# Patient Record
Sex: Female | Born: 1960 | Race: White | Hispanic: No | Marital: Married | Smoking: Current every day smoker
Health system: Southern US, Community
[De-identification: ages and names within clinical notes are randomized; demographics above are authoritative.]

## PROBLEM LIST (undated history)

## (undated) DIAGNOSIS — D649 Anemia, unspecified: Secondary | ICD-10-CM

## (undated) DIAGNOSIS — K5909 Other constipation: Secondary | ICD-10-CM

## (undated) DIAGNOSIS — F329 Major depressive disorder, single episode, unspecified: Secondary | ICD-10-CM

## (undated) DIAGNOSIS — E039 Hypothyroidism, unspecified: Secondary | ICD-10-CM

## (undated) DIAGNOSIS — Z86010 Personal history of colonic polyps: Secondary | ICD-10-CM

## (undated) DIAGNOSIS — F419 Anxiety disorder, unspecified: Secondary | ICD-10-CM

## (undated) DIAGNOSIS — F32A Depression, unspecified: Secondary | ICD-10-CM

## (undated) DIAGNOSIS — Z800 Family history of malignant neoplasm of digestive organs: Secondary | ICD-10-CM

## (undated) DIAGNOSIS — F429 Obsessive-compulsive disorder, unspecified: Secondary | ICD-10-CM

## (undated) DIAGNOSIS — G4700 Insomnia, unspecified: Secondary | ICD-10-CM

## (undated) DIAGNOSIS — S93409A Sprain of unspecified ligament of unspecified ankle, initial encounter: Secondary | ICD-10-CM

## (undated) DIAGNOSIS — C539 Malignant neoplasm of cervix uteri, unspecified: Secondary | ICD-10-CM

## (undated) DIAGNOSIS — H9192 Unspecified hearing loss, left ear: Secondary | ICD-10-CM

## (undated) DIAGNOSIS — M797 Fibromyalgia: Secondary | ICD-10-CM

## (undated) DIAGNOSIS — Z52001 Unspecified donor, stem cells: Secondary | ICD-10-CM

## (undated) DIAGNOSIS — M1990 Unspecified osteoarthritis, unspecified site: Secondary | ICD-10-CM

## (undated) DIAGNOSIS — Z8659 Personal history of other mental and behavioral disorders: Secondary | ICD-10-CM

## (undated) DIAGNOSIS — Z860101 Personal history of adenomatous and serrated colon polyps: Secondary | ICD-10-CM

## (undated) DIAGNOSIS — F410 Panic disorder [episodic paroxysmal anxiety] without agoraphobia: Secondary | ICD-10-CM

## (undated) DIAGNOSIS — F17200 Nicotine dependence, unspecified, uncomplicated: Secondary | ICD-10-CM

## (undated) HISTORY — DX: Nicotine dependence, unspecified, uncomplicated: F17.200

## (undated) HISTORY — DX: Depression, unspecified: F32.A

## (undated) HISTORY — DX: Unspecified hearing loss, left ear: H91.92

## (undated) HISTORY — DX: Fibromyalgia: M79.7

## (undated) HISTORY — DX: Sprain of unspecified ligament of unspecified ankle, initial encounter: S93.409A

## (undated) HISTORY — DX: Panic disorder (episodic paroxysmal anxiety): F41.0

## (undated) HISTORY — DX: Personal history of colonic polyps: Z86.010

## (undated) HISTORY — PX: DILATION AND CURETTAGE OF UTERUS: SHX78

## (undated) HISTORY — DX: Family history of malignant neoplasm of digestive organs: Z80.0

## (undated) HISTORY — DX: Malignant neoplasm of cervix uteri, unspecified: C53.9

## (undated) HISTORY — DX: Personal history of adenomatous and serrated colon polyps: Z86.0101

## (undated) HISTORY — DX: Obsessive-compulsive disorder, unspecified: F42.9

## (undated) HISTORY — DX: Insomnia, unspecified: G47.00

## (undated) HISTORY — DX: Anxiety disorder, unspecified: F41.9

## (undated) HISTORY — DX: Anemia, unspecified: D64.9

## (undated) HISTORY — PX: COLONOSCOPY: SHX174

## (undated) HISTORY — DX: Hypothyroidism, unspecified: E03.9

## (undated) HISTORY — DX: Major depressive disorder, single episode, unspecified: F32.9

## (undated) HISTORY — DX: Other constipation: K59.09

## (undated) HISTORY — PX: LEEP: SHX91

## (undated) HISTORY — DX: Unspecified osteoarthritis, unspecified site: M19.90

## (undated) HISTORY — DX: Unspecified donor, stem cells: Z52.001

## (undated) HISTORY — DX: Personal history of other mental and behavioral disorders: Z86.59

---

## 1979-08-17 HISTORY — PX: TONSILLECTOMY AND ADENOIDECTOMY: SHX28

## 1989-04-16 DIAGNOSIS — C539 Malignant neoplasm of cervix uteri, unspecified: Secondary | ICD-10-CM

## 1989-04-16 HISTORY — DX: Malignant neoplasm of cervix uteri, unspecified: C53.9

## 1989-08-16 HISTORY — PX: CHOLECYSTECTOMY: SHX55

## 2013-10-30 ENCOUNTER — Other Ambulatory Visit: Payer: Self-pay | Admitting: Otolaryngology

## 2013-10-30 DIAGNOSIS — R42 Dizziness and giddiness: Secondary | ICD-10-CM

## 2013-10-30 DIAGNOSIS — H9190 Unspecified hearing loss, unspecified ear: Secondary | ICD-10-CM

## 2013-10-31 ENCOUNTER — Ambulatory Visit
Admission: RE | Admit: 2013-10-31 | Discharge: 2013-10-31 | Disposition: A | Discharge status: Home / Self Care | Payer: Managed Care, Other (non HMO) | Source: Ambulatory Visit | Attending: Otolaryngology | Admitting: Otolaryngology

## 2013-10-31 DIAGNOSIS — H9190 Unspecified hearing loss, unspecified ear: Secondary | ICD-10-CM

## 2013-10-31 DIAGNOSIS — R42 Dizziness and giddiness: Secondary | ICD-10-CM

## 2013-10-31 MED ORDER — GADOBENATE DIMEGLUMINE 529 MG/ML IV SOLN
15.0000 mL | Freq: Once | INTRAVENOUS | Status: AC | PRN
  Administered 2013-10-31: 15 mL via INTRAVENOUS

## 2014-03-14 ENCOUNTER — Encounter: Payer: Self-pay | Admitting: Family Medicine

## 2014-03-14 ENCOUNTER — Ambulatory Visit (INDEPENDENT_AMBULATORY_CARE_PROVIDER_SITE_OTHER): Payer: Managed Care, Other (non HMO) | Admitting: Family Medicine

## 2014-03-14 VITALS — BP 112/79 | HR 99 | Temp 98.5°F | Ht 65.17 in | Wt 156.0 lb

## 2014-03-14 DIAGNOSIS — E039 Hypothyroidism, unspecified: Secondary | ICD-10-CM | POA: Insufficient documentation

## 2014-03-14 DIAGNOSIS — M797 Fibromyalgia: Secondary | ICD-10-CM | POA: Insufficient documentation

## 2014-03-14 DIAGNOSIS — IMO0001 Reserved for inherently not codable concepts without codable children: Secondary | ICD-10-CM

## 2014-03-14 DIAGNOSIS — F411 Generalized anxiety disorder: Secondary | ICD-10-CM | POA: Insufficient documentation

## 2014-03-14 NOTE — Assessment & Plan Note (Signed)
Continue flexeril.   We'll address this with more focus in the future after we get her anxiety issues better ironed out.

## 2014-03-14 NOTE — Progress Notes (Signed)
Pre visit review using our clinic review tool, if applicable. No additional management support is needed unless otherwise documented below in the visit note. 

## 2014-03-14 NOTE — Assessment & Plan Note (Signed)
Stable, TSH wnl 01/24/14 per pt. Continue 75 mcg levothyrox qd.  Anticipate recheck TSH 6-12 months.

## 2014-03-14 NOTE — Progress Notes (Signed)
Office Note 03/14/2014  CC:  Chief Complaint  Patient presents with  . Establish Care   HPI:  Erica King is a 53 y.o. White female who is here to establish care. Patient's most recent primary MD: Dr. Doyle Askew at [facility] in [place].  GYN care has been being done by Dr. Doyle Askew. Old records were reviewed prior to or during today's visit (some office notes brought in by pt from Dr. Doyle Askew.  Last TSH 01/24/14.  Normal--no dose change. Pap 08/2013 normal.  Says she is always anxious, "but that's just my personality".  She is upset with her prev MD b/c he wanted to push the dose of paxil and this kept making her "like a zombie".  He wanted her to see a psychiatrist for consideration of dx of bipolar d/o and this insulted the patient. She is now at 20mg  qd and says she feels ok on this: "like I have SOME emotion".  Says she has anxiety Says she takes the valium, not always tid, for her muscle cramps which she identifies as a symptom of her fibromyalgia. She acknowledges that it is for her anxiety as well. Takes flexeril 1-2 times a day for fibro as well.  Past Medical History  Diagnosis Date  . Osteoarthritis     Thumbs and low back (MRI L spine 09/2013 DDD with facet arthritis, no nerve impingement or spinal stenosis)  . OCD (obsessive compulsive disorder) 1990s    Dx'd by psychiatrist in [place]  . Hx of adenomatous colonic polyps     Pt says first at age 89.  Most recent colonoscopy 2013 showed no polyps.  Recall 5 yrs.  . Hypothyroidism Approx 2007  . Cervical cancer 1990's    LEEP 1990s  . Fibromyalgia     Per old records, rheum w/u neg in past but and pt says she was given dx of fibromyalgia.  Neurontin 1200-1800 mg/day helped initially but she stopped it after it stopped helping (she self d/c'd it 10/2013)  . History of depression   . Insomnia   . Tobacco dependence   . Chronic constipation   . Left ear hearing loss Longstanding    Past Surgical History  Procedure Laterality  Date  . Cholecystectomy  1991  . Tonsillectomy and adenoidectomy  1981  . Colonoscopy  first at age 74    Has had 6-7 of these, all with polypectomies except most recent one done 07/2012.  Repeat after 07/2017.  . Dilation and curettage of uterus    . Leep  1990s    Family History  Problem Relation Age of Onset  . CAD Mother     and father  . Multiple sclerosis Father     d. age 2  . Diabetes Mellitus II Father   . Anxiety disorder Mother   . COPD Mother     d of ruptured SB     History   Social History  . Marital Status: Married    Spouse Name: N/A    Number of Children: N/A  . Years of Education: N/A   Occupational History  . Not on file.   Social History Main Topics  . Smoking status: Current Every Day Smoker  . Smokeless tobacco: Never Used  . Alcohol Use: Yes  . Drug Use: Yes    Special: Marijuana  . Sexual Activity: Not on file   Other Topics Concern  . Not on file   Social History Narrative   Married, 2 adult children, has  grandchildren.  She has 7 sibs.   Orig from [place], relocated to [facility] [place] 08/2012.   Educ: HS and some college.   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   Tobacco 10 pack-yr hx.  Smokes marijuana occasionally, denies use of any other illegal drugs.  Alcohol: none.             Outpatient Encounter Prescriptions as of 03/14/2014  Medication Sig  . cyclobenzaprine (FLEXERIL) 10 MG tablet Take 10 mg by mouth 2 (two) times daily.  . diazepam (VALIUM) 10 MG tablet Take 10 mg by mouth 3 (three) times daily.  . Levothyroxine Sodium 75 MCG CAPS Take 75 mcg by mouth daily before breakfast.  . Multiple Vitamins-Minerals (CENTRUM ADULTS PO) Take by mouth daily.  Marland Kitchen PARoxetine (PAXIL) 20 MG tablet Take 20 mg by mouth daily.    Allergies  Allergen Reactions  . Erythromycin Other (See Comments)    Thrush  . Sudafed [Pseudoephedrine Hcl] Other (See Comments)    Heart Races    ROS Review of Systems   Constitutional: Negative for fever and fatigue.  HENT: Negative for congestion and sore throat.   Eyes: Negative for visual disturbance.  Respiratory: Negative for cough.   Cardiovascular: Negative for chest pain.  Gastrointestinal: Negative for nausea and abdominal pain.  Genitourinary: Negative for dysuria.  Musculoskeletal: Positive for arthralgias (thumbs) and back pain. Negative for joint swelling.  Skin: Negative for rash.  Neurological: Negative for weakness and headaches.  Hematological: Negative for adenopathy.  Psychiatric/Behavioral: The patient is nervous/anxious.     PE; Blood pressure 112/79, pulse 99, temperature 98.5 F (36.9 C), temperature source Temporal, height 5' 5.17" (1.655 m), weight 156 lb (70.761 kg), SpO2 100.00%. Gen: Alert, well appearing.  Patient is oriented to person, place, time, and situation. GLO:VFIE: no injection, icteris, swelling, or exudate.  EOMI, PERRLA. Mouth: lips without lesion/swelling.  Oral mucosa pink and moist. Oropharynx without erythema, exudate, or swelling.  Neck - No masses or thyromegaly or limitation in range of motion CV: RRR, no m/r/g.   LUNGS: CTA bilat, nonlabored resps, good aeration in all lung fields. EXT: no clubbing, cyanosis, or edema.   Pertinent labs:  none  ASSESSMENT AND PLAN:   New pt; reviewed all old records pt brought in today from previous PCP in [place], PCP in [place], [facility] in [place], [facility] in [place].  GAD (generalized anxiety disorder) A bit of a mixed bag as far as psych history goes. She will continue current meds for this: paxil at 20mg  qd, valium 10mg  up to tid. She'll return in 3 mo and we'll address this and see if we can't get more of a psych hx from her and see if she has some bipolar features that stand out.  She is very outspoken, almost to the point of being abrasive.  Seems to be able to turn on the charm when needed but has no problem telling me when she doesn't like something.    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  Controlled substance contract reviewed with patient today b/c of her chronic use of valium.  (Of note, per [place] controlled substance web site, she last got 90 d mail order supply of this filled 02/12/14, #240 tabs).  Patient signed this and it will be placed in the chart.  No UDS today but will do this at next f/u visit in 3 mo.   Fibromyalgia Continue flexeril.   We'll address this with more focus  in the future after we get her anxiety issues better ironed out.  Hypothyroidism Stable, TSH wnl 01/24/14 per pt. Continue 75 mcg levothyrox qd.  Anticipate recheck TSH 6-12 months.   An After Visit Summary was printed and given to the patient.  Return in about 3 months (around 06/14/2014) for 30 min to discuss anxiety treatment/UDS.

## 2014-03-14 NOTE — Assessment & Plan Note (Addendum)
A bit of a mixed bag as far as psych history goes. She will continue current meds for this: paxil at 20mg  qd, valium 10mg  up to tid. She'll return in 3 mo and we'll address this and see if we can't get more of a psych hx from her and see if she has some bipolar features that stand out.  She is very outspoken, almost to the point of being abrasive.  Seems to be able to turn on the charm when needed but has no problem telling me when she doesn't like something.   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  Controlled substance contract reviewed with patient today b/c of her chronic use of valium.  (Of note, per [place] controlled substance web site, she last got 90 d mail order supply of this filled 02/12/14, #240 tabs).  Patient signed this and it will be placed in the chart.  No UDS today but will do this at next f/u visit in 3 mo.

## 2014-04-30 ENCOUNTER — Telehealth: Payer: Self-pay | Admitting: Family Medicine

## 2014-04-30 MED ORDER — PAROXETINE HCL 20 MG PO TABS: 20.0000 mg | ORAL_TABLET | Freq: Every day | ORAL | Status: DC

## 2014-04-30 NOTE — Telephone Encounter (Signed)
Patient called this afternoon extremely emotional stating that she needed a rf of paxil.  I sent in paxil to [facility].   Patient then ranted on about refusing UDS because she smokes marijuana on occasion and she wants to know if she needs a referral to psych to get her RX medication.  She told me multiple times to make Dr. Anitra Lauth aware that she has never asked for these RX's but MD's put her on these because of her medical needs.  Please advise if you want her to see psych or come in to discuss.

## 2014-05-01 ENCOUNTER — Other Ambulatory Visit: Payer: Self-pay | Admitting: Family Medicine

## 2014-05-01 DIAGNOSIS — F411 Generalized anxiety disorder: Secondary | ICD-10-CM

## 2014-05-01 NOTE — Telephone Encounter (Signed)
I'll refer her to psychiatrist.  -thx

## 2014-05-09 NOTE — Telephone Encounter (Signed)
Patient came in to pay her bill. She mentioned that she has made an appointment with Dr Marvia Pickles for 05/22/14. I advised her I would note it for her. She said she felt like Dr. Bobetta Lime will be a good fit for her. Patient is struggling with family issues.

## 2014-05-09 NOTE — Telephone Encounter (Signed)
OK, so I don't need to do anything right?

## 2014-05-13 NOTE — Telephone Encounter (Signed)
You don't need to do anything, this is just an [place] :)

## 2014-05-22 ENCOUNTER — Telehealth: Payer: Self-pay | Admitting: Family Medicine

## 2014-05-22 NOTE — Telephone Encounter (Signed)
Caller: Tanya/Patient; Phone: [phone]; Reason for Call: Patient states she is calling to inform Dr.  Anitra Lauth that she had her appt.  With Dr.  Marvia Pickles 05/22/14.  Patient states she has a follow up appt.  With Dr.  Bobetta Lime 05/30/14.  Patient states because she had reported to Dr.  Anitra Lauth that she smokes Marijuana, he would not prescribe any controlled substances.   Patient states that Dr.  Bobetta Lime wants patient to continue Paxil and Diazepam.  States Dr.  Bobetta Lime will prescribe the above medications until she can "wean me off of it.  " Patient states the only other medication that she is prescribed is Levothyroxine, Flexeril and Hydrocodone.  Patient states she receives a Rx for Hydrocodone, 30 tablets, q 3 months.   Patient states Dr.  Bobetta Lime is going to refill the Paxil and Diazepam.  Patient states will need refills for Flexeril 10mg .  And Levothyroxine 47mcg.  Patient states she uses Product/process development scientist and receives 3 month supply.  Patient states she has a one month supply of above medications remaining.  Patient is calling to inquire if Dr.  Anitra Lauth will manage the above medications.  Patient states she also has a Rx for Hydrocodone that was filled in 09/2013 and states she has 7 tablets remaining.  Patient states she understands if Dr.  Anitra Lauth does not agree to fill Hydrocodone.   Please return call to patient at [phone] with recommendation, per Dr.  Anitra Lauth.   Patient is inquiring if Dr.  Anitra Lauth agrees to manage her as a Patient.  Patient has an appt.  Scheduled, with Dr.  Anitra Lauth, on 06/14/14.

## 2014-05-23 NOTE — Telephone Encounter (Signed)
Pls notify pt that I will continue to see her but if the issue of me needing to manage any controlled substances comes up then she still has to honor the controlled substance contract that she signed last time she was here. If she does not want to honor this contract, then I will still be her doctor if she wants but I will have to decline to ever prescribe her any longterm controlled substances (like hydrocodone or valium, for example).-thx

## 2014-05-24 NOTE — Telephone Encounter (Signed)
05/23/2014 talk to Kathlyne about her question in regards to Dr. Anitra Lauth continuing her care. I explained to pt. that as long as she abided by her controlled substance contract Dr. Anitra Lauth would be glad to see her. Pt responded that she did not want Dr. Anitra Lauth to prescribe her any controlled substances. Pt. stated she is cleaning up her life and working very hard at getting better and was very happy that Dr. Anitra Lauth would still see her. We confirmed her Jun 14, 2014 appt.   Alberteen Sam

## 2014-06-14 ENCOUNTER — Encounter: Payer: Self-pay | Admitting: Family Medicine

## 2014-06-14 ENCOUNTER — Ambulatory Visit (INDEPENDENT_AMBULATORY_CARE_PROVIDER_SITE_OTHER): Payer: Managed Care, Other (non HMO) | Admitting: Family Medicine

## 2014-06-14 VITALS — BP 135/83 | HR 86 | Temp 98.1°F | Resp 18 | Ht 65.0 in | Wt 155.0 lb

## 2014-06-14 DIAGNOSIS — Z23 Encounter for immunization: Secondary | ICD-10-CM

## 2014-06-14 DIAGNOSIS — E039 Hypothyroidism, unspecified: Secondary | ICD-10-CM

## 2014-06-14 DIAGNOSIS — Z1239 Encounter for other screening for malignant neoplasm of breast: Secondary | ICD-10-CM | POA: Insufficient documentation

## 2014-06-14 DIAGNOSIS — F411 Generalized anxiety disorder: Secondary | ICD-10-CM

## 2014-06-14 DIAGNOSIS — M797 Fibromyalgia: Secondary | ICD-10-CM

## 2014-06-14 DIAGNOSIS — M654 Radial styloid tenosynovitis [de Quervain]: Secondary | ICD-10-CM

## 2014-06-14 MED ORDER — LEVOTHYROXINE SODIUM 75 MCG PO CAPS: 75.0000 ug | ORAL_CAPSULE | Freq: Every day | ORAL | Status: DC

## 2014-06-14 NOTE — Assessment & Plan Note (Signed)
Pt is weening off of vicodin and flexeril under the direction of her psychiatrist. This is the patient's choice and she fully admits she'll likely return in the future to get back on these meds.

## 2014-06-14 NOTE — Assessment & Plan Note (Signed)
- 

## 2014-06-14 NOTE — Assessment & Plan Note (Signed)
Bilateral. Injected left thumb today. Procedure: Used 1 inch 25 G needle to inject 1/2 ml of 40mg /ml depo medrol + 1/2 ml of 2% lidocaine w/out epi into the area of most tenderness in the tendon sheath on extensor surface of left thumb as it coarsed towards radial styloid.  Pt tolerated procedure well, no immediate complications.

## 2014-06-14 NOTE — Assessment & Plan Note (Signed)
Pt weening off paxil and diazepam under the direction of her psychiatrist. This is the patient's choice.

## 2014-06-14 NOTE — Progress Notes (Signed)
Pre visit review using our clinic review tool, if applicable. No additional management support is needed unless otherwise documented below in the visit note. 

## 2014-06-14 NOTE — Assessment & Plan Note (Addendum)
The current medical regimen is effective;  continue present plan and medications. TSH 4 mo ago wnl. Plan on TSH recheck at next routine office f/u in 6 mo.

## 2014-06-14 NOTE — Progress Notes (Signed)
OFFICE NOTE  06/14/2014  CC:  Chief Complaint  Patient presents with  . Follow-up   HPI: Patient is a 53 y.o. Caucasian female who is here for 3 mo f/u fibromyalgia, anxiety disorder. Has started seeing a psychiatrist 05/22/14 and is happy with this. She wishes to ween off all meds except her levothyroxine b/c she is afraid of what it is doing to her body. Has done this before in the past for about a year at a time, eventually gets back on everything b/c sx's persist.  Her psychiatrist is weening her off paxil, diazepam, flexeril, and vicodin.  She is seeing a reflexologist named Jodene Nam in [place] and she has started her on probiotic, OTC digestive enzymes, and magnesium.   Has hx of bilat thumb pain, has had injections in each, most recent about 2012. Right hurts worst now.   Pertinent PMH:  Past medical, surgical, social, and family history reviewed and no changes are noted since last office visit.  MEDS:  Outpatient Prescriptions Prior to Visit  Medication Sig Dispense Refill  . cyclobenzaprine (FLEXERIL) 10 MG tablet Take 10 mg by mouth 2 (two) times daily.      . diazepam (VALIUM) 10 MG tablet Take 10 mg by mouth 3 (three) times daily.      . Multiple Vitamins-Minerals (CENTRUM ADULTS PO) Take by mouth daily.      Marland Kitchen PARoxetine (PAXIL) 20 MG tablet Take 1 tablet (20 mg total) by mouth daily.  90 tablet  0  . Levothyroxine Sodium 75 MCG CAPS Take 75 mcg by mouth daily before breakfast.       No facility-administered medications prior to visit.    PE: Blood pressure 135/83, pulse 86, temperature 98.1 F (36.7 C), temperature source Oral, resp. rate 18, height 5\' 5"  (1.651 m), weight 155 lb (70.308 kg), SpO2 100.00%. Gen: Alert, well appearing.  Patient is oriented to person, place, time, and situation. Wrists and thumbs without swelling, erythema, or limitation of ROM. Finkelstein's + bilat. TTP over dorsal aspect of thumb proximally, bilaterally. Left side more  tender on exam today than right side.    IMPRESSION AND PLAN:  Tennis Must Quervain's disease (tenosynovitis) Bilateral. Injected left thumb today. Procedure: Used 1 inch 25 G needle to inject 1/2 ml of 40mg /ml depo medrol + 1/2 ml of 2% lidocaine w/out epi into the area of most tenderness in the tendon sheath on extensor surface of left thumb as it coarsed towards radial styloid.  Pt tolerated procedure well, no immediate complications.    Fibromyalgia Pt is weening off of vicodin and flexeril under the direction of her psychiatrist. This is the patient's choice and she fully admits she'll likely return in the future to get back on these meds.  GAD (generalized anxiety disorder) Pt weening off paxil and diazepam under the direction of her psychiatrist. This is the patient's choice.  Breast cancer screening Ordered screening mammogram today.  Hypothyroidism The current medical regimen is effective;  continue present plan and medications. TSH 4 mo ago wnl. Plan on TSH recheck at next routine office f/u in 6 mo.   An After Visit Summary was printed and given to the patient.  FOLLOW UP: 6 mo

## 2014-06-18 ENCOUNTER — Telehealth: Payer: Self-pay | Admitting: Family Medicine

## 2014-06-18 NOTE — Telephone Encounter (Signed)
emmi mailed  °

## 2014-07-04 ENCOUNTER — Ambulatory Visit
Admission: RE | Admit: 2014-07-04 | Discharge: 2014-07-04 | Disposition: A | Discharge status: Home / Self Care | Payer: Managed Care, Other (non HMO) | Source: Ambulatory Visit | Attending: Family Medicine | Admitting: Family Medicine

## 2014-07-04 DIAGNOSIS — Z1239 Encounter for other screening for malignant neoplasm of breast: Secondary | ICD-10-CM

## 2014-08-19 ENCOUNTER — Ambulatory Visit (INDEPENDENT_AMBULATORY_CARE_PROVIDER_SITE_OTHER): Payer: Managed Care, Other (non HMO) | Admitting: Family Medicine

## 2014-08-19 ENCOUNTER — Encounter: Payer: Self-pay | Admitting: Family Medicine

## 2014-08-19 VITALS — BP 134/90 | HR 86 | Temp 97.8°F | Resp 18 | Ht 65.0 in | Wt 159.0 lb

## 2014-08-19 DIAGNOSIS — M6780 Other specified disorders of synovium and tendon, unspecified site: Secondary | ICD-10-CM

## 2014-08-19 NOTE — Progress Notes (Signed)
OFFICE NOTE  08/19/2014  CC:  Chief Complaint  Patient presents with  . Mass    right thumb knuckle x 1 month   HPI: Patient is a 54 y.o. Caucasian female who is here for right thumb complaint. Has noted right thumb nodular swelling focally over distal knuckle for last 3-4 weeks.  No pain, no redness. No hx of injury to thumb.   Pertinent PMH:  Past medical, surgical, social, and family history reviewed and no changes are noted since last office visit.  MEDS:  Outpatient Prescriptions Prior to Visit  Medication Sig Dispense Refill  . cyclobenzaprine (FLEXERIL) 10 MG tablet Take 10 mg by mouth 2 (two) times daily.    . diazepam (VALIUM) 10 MG tablet Take 10 mg by mouth 3 (three) times daily.    . Levothyroxine Sodium 75 MCG CAPS Take 1 capsule (75 mcg total) by mouth daily before breakfast. 90 capsule 3  . Multiple Vitamins-Minerals (CENTRUM ADULTS PO) Take by mouth daily.    Marland Kitchen PARoxetine (PAXIL) 20 MG tablet Take 1 tablet (20 mg total) by mouth daily. 90 tablet 0   No facility-administered medications prior to visit.    PE: Blood pressure 134/90, pulse 86, temperature 97.8 F (36.6 C), temperature source Temporal, resp. rate 18, height 5\' 5"  (1.651 m), weight 159 lb (72.122 kg), SpO2 99 %. Gen: Alert, well appearing.  Patient is oriented to person, place, time, and situation. Right thumb IP joint has bee-bee sized firm nodule overlying it that is barely moveable and is nontender.  No erythema, no fluctuance.  Thumb extension and flexion intact.  IMPRESSION AND PLAN:  Right thumb extensor tendon sheath cyst. Reassured pt, watchful waiting approach discussed. Signs/symptoms to call or return for were reviewed and pt expressed understanding.  An After Visit Summary was printed and given to the patient.  FOLLOW UP: prn

## 2014-08-19 NOTE — Progress Notes (Signed)
Pre visit review using our clinic review tool, if applicable. No additional management support is needed unless otherwise documented below in the visit note. 

## 2014-12-10 ENCOUNTER — Encounter: Payer: Managed Care, Other (non HMO) | Admitting: Family Medicine

## 2015-01-22 ENCOUNTER — Ambulatory Visit (INDEPENDENT_AMBULATORY_CARE_PROVIDER_SITE_OTHER): Payer: Managed Care, Other (non HMO) | Admitting: Family Medicine

## 2015-01-22 ENCOUNTER — Encounter: Payer: Self-pay | Admitting: Family Medicine

## 2015-01-22 VITALS — BP 146/81 | HR 71 | Temp 97.8°F | Resp 16 | Ht 65.75 in | Wt 155.0 lb

## 2015-01-22 DIAGNOSIS — Z0000 Encounter for general adult medical examination without abnormal findings: Secondary | ICD-10-CM

## 2015-01-22 DIAGNOSIS — Z124 Encounter for screening for malignant neoplasm of cervix: Secondary | ICD-10-CM

## 2015-01-22 DIAGNOSIS — E039 Hypothyroidism, unspecified: Secondary | ICD-10-CM | POA: Diagnosis not present

## 2015-01-22 DIAGNOSIS — J441 Chronic obstructive pulmonary disease with (acute) exacerbation: Secondary | ICD-10-CM | POA: Diagnosis not present

## 2015-01-22 LAB — CBC WITH DIFFERENTIAL/PLATELET
BASOS ABS: 0.1 10*3/uL (ref 0.0–0.1)
Basophils Relative: 0.5 % (ref 0.0–3.0)
EOS PCT: 1.9 % (ref 0.0–5.0)
Eosinophils Absolute: 0.2 10*3/uL (ref 0.0–0.7)
HEMATOCRIT: 47.2 % — AB (ref 36.0–46.0)
Hemoglobin: 15.6 g/dL — ABNORMAL HIGH (ref 12.0–15.0)
Lymphocytes Relative: 20.6 % (ref 12.0–46.0)
Lymphs Abs: 2.6 10*3/uL (ref 0.7–4.0)
MCHC: 33.1 g/dL (ref 30.0–36.0)
MCV: 94.1 fl (ref 78.0–100.0)
Monocytes Absolute: 0.7 10*3/uL (ref 0.1–1.0)
Monocytes Relative: 5.5 % (ref 3.0–12.0)
NEUTROS PCT: 71.5 % (ref 43.0–77.0)
Neutro Abs: 8.9 10*3/uL — ABNORMAL HIGH (ref 1.4–7.7)
PLATELETS: 388 10*3/uL (ref 150.0–400.0)
RBC: 5.02 Mil/uL (ref 3.87–5.11)
RDW: 14.1 % (ref 11.5–15.5)
WBC: 12.5 10*3/uL — AB (ref 4.0–10.5)

## 2015-01-22 LAB — LIPID PANEL
CHOLESTEROL: 208 mg/dL — AB (ref 0–200)
HDL: 48.4 mg/dL (ref >39.00)
LDL Cholesterol: 128 mg/dL — ABNORMAL HIGH (ref 0–99)
NONHDL: 159.6
Total CHOL/HDL Ratio: 4
Triglycerides: 156 mg/dL — ABNORMAL HIGH (ref 0.0–149.0)
VLDL: 31.2 mg/dL (ref 0.0–40.0)

## 2015-01-22 LAB — COMPREHENSIVE METABOLIC PANEL
ALBUMIN: 4.6 g/dL (ref 3.5–5.2)
ALT: 12 U/L (ref 0–35)
AST: 15 U/L (ref 0–37)
Alkaline Phosphatase: 72 U/L (ref 39–117)
BUN: 15 mg/dL (ref 6–23)
CO2: 28 meq/L (ref 19–32)
Calcium: 10 mg/dL (ref 8.4–10.5)
Chloride: 105 mEq/L (ref 96–112)
Creatinine, Ser: 0.93 mg/dL (ref 0.40–1.20)
GFR: 66.41 mL/min (ref >60.00)
GLUCOSE: 102 mg/dL — AB (ref 70–99)
Potassium: 5.1 mEq/L (ref 3.5–5.1)
Sodium: 139 mEq/L (ref 135–145)
Total Bilirubin: 0.5 mg/dL (ref 0.2–1.2)
Total Protein: 7.3 g/dL (ref 6.0–8.3)

## 2015-01-22 LAB — TSH: TSH: 2.08 u[IU]/mL (ref 0.35–4.50)

## 2015-01-22 MED ORDER — TRAMADOL HCL 50 MG PO TABS: ORAL_TABLET | ORAL | Status: DC

## 2015-01-22 MED ORDER — ALBUTEROL SULFATE HFA 108 (90 BASE) MCG/ACT IN AERS
1.0000 | INHALATION_SPRAY | RESPIRATORY_TRACT | Status: DC | PRN
Start: 2015-01-22 — End: 2015-04-08

## 2015-01-22 MED ORDER — PREDNISONE 20 MG PO TABS: ORAL_TABLET | ORAL | Status: DC

## 2015-01-22 MED ORDER — DOXYCYCLINE HYCLATE 100 MG PO TABS: ORAL_TABLET | ORAL | Status: DC

## 2015-01-22 NOTE — Patient Instructions (Signed)
Apply bactroban ointment, cover with dry gauze, apply support bra.

## 2015-01-22 NOTE — Progress Notes (Signed)
Pre visit review using our clinic review tool, if applicable. No additional management support is needed unless otherwise documented below in the visit note. 

## 2015-01-22 NOTE — Progress Notes (Signed)
Office Note 01/29/2015  CC:  Chief Complaint  Patient presents with  . Annual Exam    Pt is fasting  . URI    X 2 weeks    HPI:  Erica King is a 54 y.o. White female who is here for annual health maintenance exam, fasting. Also has some URI sx's-prolonged: nasal congestion/mucous, ears crackling, thick/white yellow mucous coming of infrequently with coughing.  No known fevers.  +Wheezing.  No SOB.  Mucinex DM daily for the last week.  +ST present initially but this resolved after 4 d.  She desires referral to GYN for cervical ca screening.  Pt reports that last pap was 08/2013 and was normal but she has hx of abnormal PAP/LEEP procedure.  Past Medical History  Diagnosis Date  . Osteoarthritis     Thumbs and low back (MRI L spine 09/2013 DDD with facet arthritis, no nerve impingement or spinal stenosis)  . OCD (obsessive compulsive disorder) 1990s    Dx'd by psychiatrist in [place]  . Hx of adenomatous colonic polyps     Pt says first at age 33.  Most recent colonoscopy 2013 showed no polyps.  Recall 5 yrs.  . Hypothyroidism Approx 2007  . Cervical cancer 1990's    LEEP 1990s  . Fibromyalgia     Per old records, rheum w/u neg in past but and pt says she was given dx of fibromyalgia.  Neurontin 1200-1800 mg/day helped initially but she stopped it after it stopped helping (she self d/c'd it 10/2013)  . History of depression   . Insomnia   . Tobacco dependence   . Chronic constipation   . Left ear hearing loss Longstanding    Past Surgical History  Procedure Laterality Date  . Cholecystectomy  1991  . Tonsillectomy and adenoidectomy  1981  . Colonoscopy  first at age 79    Has had 6-7 of these, all with polypectomies except most recent one done 07/2012.  Repeat after 07/2017.  . Dilation and curettage of uterus    . Leep  1990s    Family History  Problem Relation Age of Onset  . CAD Mother     and father  . Multiple sclerosis Father     d. age 44  . Diabetes  Mellitus II Father   . Anxiety disorder Mother   . COPD Mother     d of ruptured SB     History   Social History  . Marital Status: Married    Spouse Name: N/A  . Number of Children: N/A  . Years of Education: N/A   Occupational History  . Not on file.   Social History Main Topics  . Smoking status: Current Every Day Smoker  . Smokeless tobacco: Never Used  . Alcohol Use: Yes  . Drug Use: Yes    Special: Marijuana  . Sexual Activity: Not on file   Other Topics Concern  . Not on file   Social History Narrative   Married, 2 adult children, has grandchildren.  She has 7 sibs.   Orig from [place], relocated to [facility] 08/2012.   Educ: HS and some college.   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   Tobacco 10 pack-yr hx.  Smokes marijuana occasionally, denies use of any other illegal drugs.  Alcohol: none.             Outpatient Prescriptions Prior to Visit  Medication Sig Dispense Refill  . cyclobenzaprine (FLEXERIL) 10 MG  tablet Take 10 mg by mouth 2 (two) times daily.    . Levothyroxine Sodium 75 MCG CAPS Take 1 capsule (75 mcg total) by mouth daily before breakfast. 90 capsule 3  . Multiple Vitamins-Minerals (CENTRUM ADULTS PO) Take by mouth daily.    Marland Kitchen PARoxetine (PAXIL) 20 MG tablet Take 1 tablet (20 mg total) by mouth daily. 90 tablet 0  . diazepam (VALIUM) 10 MG tablet Take 10 mg by mouth 3 (three) times daily.     No facility-administered medications prior to visit.    Allergies  Allergen Reactions  . Erythromycin Other (See Comments)    Thrush  . Sudafed [Pseudoephedrine Hcl] Other (See Comments)    Heart Races    ROS Review of Systems  Constitutional: Negative for fever, chills, appetite change and fatigue.  HENT: Negative for congestion, dental problem, ear pain and sore throat.   Eyes: Negative for discharge, redness and visual disturbance.  Respiratory:       See HPI  Cardiovascular: Negative for chest pain,  palpitations and leg swelling.  Gastrointestinal: Negative for nausea, vomiting, abdominal pain, diarrhea and blood in stool.  Genitourinary: Negative for dysuria, urgency, frequency, hematuria, flank pain and difficulty urinating.  Musculoskeletal: Negative for myalgias, back pain, joint swelling, arthralgias and neck stiffness.  Skin: Negative for pallor and rash.  Neurological: Negative for dizziness, speech difficulty, weakness and headaches.  Hematological: Negative for adenopathy. Does not bruise/bleed easily.  Psychiatric/Behavioral: Negative for confusion and sleep disturbance. The patient is not nervous/anxious.     PE; Blood pressure 146/81, pulse 71, temperature 97.8 F (36.6 C), temperature source Oral, resp. rate 16, height 5' 5.75" (1.67 m), weight 155 lb (70.308 kg), SpO2 95 %. Gen: Alert, well appearing.  Patient is oriented to person, place, time, and situation. AFFECT: pleasant, lucid thought and speech. ENT: Ears: EACs clear, normal epithelium.  TMs with good light reflex and landmarks bilaterally.  Eyes: no injection, icteris, swelling, or exudate.  EOMI, PERRLA. Nose: no drainage or turbinate edema/swelling.  No injection or focal lesion.  Mouth: lips without lesion/swelling.  Oral mucosa pink and moist.  Dentition intact and without obvious caries or gingival swelling.  Oropharynx without erythema, exudate, or swelling.  Neck: supple/nontender.  No LAD, mass, or TM.  Carotid pulses 2+ bilaterally, without bruits. CV: RRR, no m/r/g.   LUNGS: CTA bilat on inspiration, with trace diffuse exp wheezing, post-exhalation coughing, minimal prolongation of exp phase.  Nonlabored resps, decent aeration in all lung fields. ABD: soft, NT, ND, BS normal.  No hepatospenomegaly or mass.  No bruits. EXT: no clubbing, cyanosis, or edema.  Musculoskeletal: no joint swelling, erythema, warmth, or tenderness.  ROM of all joints intact. Skin - no sores or suspicious lesions or rashes or color  changes   Pertinent labs:  NONE  ASSESSMENT AND PLAN:   1) URI with COPD exacerbation. Prednisone 40mg  qd x 5d, then 20mg  qd x 5d. Doxy 100 mg bid x 10d. Ventolin inhaler rx'd today + nurse did inhaler education today.  2) Health maintenance exam: Reviewed age and gender appropriate health maintenance issues (prudent diet, regular exercise, health risks of tobacco and excessive alcohol, use of seatbelts, fire alarms in home, use of sunscreen).  Also reviewed age and gender appropriate health screening as well as vaccine recommendations. Next colonoscopy due 2018. GYN referral for cervical cancer screening. Fasting HP labs done today.  An After Visit Summary was printed and given to the patient.  FOLLOW UP:  Return in about  4 weeks (around 02/19/2015) for COPD f/u.

## 2015-02-21 ENCOUNTER — Encounter: Payer: Self-pay | Admitting: Family Medicine

## 2015-02-21 ENCOUNTER — Ambulatory Visit (INDEPENDENT_AMBULATORY_CARE_PROVIDER_SITE_OTHER): Payer: Managed Care, Other (non HMO) | Admitting: Family Medicine

## 2015-02-21 VITALS — BP 92/63 | HR 80 | Temp 98.8°F | Resp 18 | Ht 65.75 in | Wt 157.0 lb

## 2015-02-21 DIAGNOSIS — J438 Other emphysema: Secondary | ICD-10-CM | POA: Diagnosis not present

## 2015-02-21 DIAGNOSIS — D751 Secondary polycythemia: Secondary | ICD-10-CM | POA: Diagnosis not present

## 2015-02-21 DIAGNOSIS — D72829 Elevated white blood cell count, unspecified: Secondary | ICD-10-CM | POA: Diagnosis not present

## 2015-02-21 DIAGNOSIS — F17200 Nicotine dependence, unspecified, uncomplicated: Secondary | ICD-10-CM

## 2015-02-21 DIAGNOSIS — Z52001 Unspecified donor, stem cells: Secondary | ICD-10-CM

## 2015-02-21 HISTORY — DX: Unspecified donor, stem cells: Z52.001

## 2015-02-21 LAB — CBC WITH DIFFERENTIAL/PLATELET
Basophils Absolute: 0.1 10*3/uL (ref 0.0–0.1)
Basophils Relative: 0.6 % (ref 0.0–3.0)
EOS ABS: 0.2 10*3/uL (ref 0.0–0.7)
Eosinophils Relative: 2.7 % (ref 0.0–5.0)
HCT: 45.8 % (ref 36.0–46.0)
HEMOGLOBIN: 14.9 g/dL (ref 12.0–15.0)
Lymphocytes Relative: 33 % (ref 12.0–46.0)
Lymphs Abs: 3 10*3/uL (ref 0.7–4.0)
MCHC: 32.7 g/dL (ref 30.0–36.0)
MCV: 95 fl (ref 78.0–100.0)
Monocytes Absolute: 0.6 10*3/uL (ref 0.1–1.0)
Monocytes Relative: 6.3 % (ref 3.0–12.0)
NEUTROS PCT: 57.4 % (ref 43.0–77.0)
Neutro Abs: 5.2 10*3/uL (ref 1.4–7.7)
Platelets: 443 10*3/uL — ABNORMAL HIGH (ref 150.0–400.0)
RBC: 4.82 Mil/uL (ref 3.87–5.11)
RDW: 15.2 % (ref 11.5–15.5)
WBC: 9 10*3/uL (ref 4.0–10.5)

## 2015-02-21 NOTE — Progress Notes (Signed)
Pre visit review using our clinic review tool, if applicable. No additional management support is needed unless otherwise documented below in the visit note. 

## 2015-02-21 NOTE — Progress Notes (Signed)
OFFICE VISIT  02/21/2015   CC:  Chief Complaint  Patient presents with  . Follow-up   HPI:    Patient is a 54 y.o. Caucasian female who presents for 1 mo f/u COPD exacerbation. Finished course of prednisone and abx. Feels improved from COPD standpoint, still feels some mild chest tightness and chronic mild cough but feels like she is at baseline.  She stopped the ventolin inhaler when she was feeling better.  Recently patient had an abnormal CBC: HB elevated and WBC elevated.  Here for repeat of these tests today along with pathologist's smear review. Her brother had acute myeloid leukemia at age 79 and she gave stem cell donation to him in 12/2009 and before this she was given a med to boost her bone marrow production.  This was part of a clinical trial in [place].    ROS: chronic mild/mod pain in both sides of mid back. Cramping of both feet.  Anxiety attacks x 2.  Pt interested in info about smoking cessation but has not reached the point of decision about quitting yet.    Past Medical History  Diagnosis Date  . Osteoarthritis     Thumbs and low back (MRI L spine 09/2013 DDD with facet arthritis, no nerve impingement or spinal stenosis)  . OCD (obsessive compulsive disorder) 1990s    Dx'd by psychiatrist in [place]  . Hx of adenomatous colonic polyps     Pt says first at age 33.  Most recent colonoscopy 2013 showed no polyps.  Recall 5 yrs.  . Hypothyroidism Approx 2007  . Cervical cancer 1990's    LEEP 1990s  . Fibromyalgia     Per old records, rheum w/u neg in past but and pt says she was given dx of fibromyalgia.  Neurontin 1200-1800 mg/day helped initially but she stopped it after it stopped helping (she self d/c'd it 10/2013)  . History of depression   . Insomnia   . Tobacco dependence   . Chronic constipation   . Left ear hearing loss Longstanding    Past Surgical History  Procedure Laterality Date  . Cholecystectomy  1991  . Tonsillectomy and adenoidectomy  1981   . Colonoscopy  first at age 75    Has had 6-7 of these, all with polypectomies except most recent one done 07/2012.  Repeat after 07/2017.  . Dilation and curettage of uterus    . Leep  1990s    Outpatient Prescriptions Prior to Visit  Medication Sig Dispense Refill  . gabapentin (NEURONTIN) 300 MG capsule Take 300 mg by mouth. Taking 4 cap at bedtime as needed    . Levothyroxine Sodium 75 MCG CAPS Take 1 capsule (75 mcg total) by mouth daily before breakfast. 90 capsule 3  . Multiple Vitamins-Minerals (CENTRUM ADULTS PO) Take by mouth daily.    Marland Kitchen PARoxetine (PAXIL) 20 MG tablet Take 1 tablet (20 mg total) by mouth daily. 90 tablet 0  . albuterol (VENTOLIN HFA) 108 (90 BASE) MCG/ACT inhaler Inhale 1-2 puffs into the lungs every 4 (four) hours as needed for wheezing or shortness of breath. (Patient not taking: Reported on 02/21/2015) 1 Inhaler 1  . cyclobenzaprine (FLEXERIL) 10 MG tablet Take 10 mg by mouth 2 (two) times daily.    . diazepam (VALIUM) 10 MG tablet Take 10 mg by mouth 3 (three) times daily.    Marland Kitchen doxycycline (VIBRA-TABS) 100 MG tablet 1 tab po bid x 10d 20 tablet 0  . predniSONE (DELTASONE) 20 MG tablet 2  tabs po qd x 5d, then 1 tab po qd x 5d 15 tablet 0   No facility-administered medications prior to visit.    Allergies  Allergen Reactions  . Erythromycin Other (See Comments)    Thrush  . Sudafed [Pseudoephedrine Hcl] Other (See Comments)    Heart Races    ROS As per HPI  PE: Blood pressure 92/63, pulse 80, temperature 98.8 F (37.1 C), temperature source Temporal, resp. rate 18, height 5' 5.75" (1.67 m), weight 157 lb (71.215 kg), SpO2 99 %. Gen: Alert, well appearing.  Patient is oriented to person, place, time, and situation. CV: RRR, no m/r/g.   LUNGS: CTA bilat, nonlabored resps, good aeration in all lung fields. EXT: no clubbing, cyanosis, or edema.    LABS:  Lab Results  Component Value Date   TSH 2.08 01/22/2015   Lab Results  Component Value Date    WBC 12.5* 01/22/2015   HGB 15.6* 01/22/2015   HCT 47.2* 01/22/2015   MCV 94.1 01/22/2015   PLT 388.0 01/22/2015   Lab Results  Component Value Date   CREATININE 0.93 01/22/2015   BUN 15 01/22/2015   NA 139 01/22/2015   K 5.1 01/22/2015   CL 105 01/22/2015   CO2 28 01/22/2015   Lab Results  Component Value Date   ALT 12 01/22/2015   AST 15 01/22/2015   ALKPHOS 72 01/22/2015   BILITOT 0.5 01/22/2015   Lab Results  Component Value Date   CHOL 208* 01/22/2015   Lab Results  Component Value Date   HDL 48.40 01/22/2015   Lab Results  Component Value Date   LDLCALC 128* 01/22/2015   Lab Results  Component Value Date   TRIG 156.0* 01/22/2015   Lab Results  Component Value Date   CHOLHDL 4 01/22/2015   IMPRESSION AND PLAN:  1) COPD exacerbation; resolved.  Back at baseline.  Still smoking, encouraged complete cessation.  2) Tobacco dependence: contemplative stage of quitting, wants to come back in 6 wks to develop a complete cessation plan/get rx's.  Today we spent 10 min going over options for quitting, including use of nicotine replacement, bupropion, and/or chantix.  She indicates she is interested in the nicotine replacement + bupropion combo.  3) Mild polycythemia and leukocytosis: need to repeat CBC with diff and do pathologist's smear review today. If still elevated WBC count I will have her see oncologist for further eval given her hx of having received a med to stimulate her bone marrow in 2011.  I would be worried about an acute leukemia in this scenario. Of course, her WBC elevation AND her polycythemia could simply be attributed to her smoking. Also, with polycythemia, must consider undiagnosed/untreated OSA with nocturnal hypoxemia as cause. Will await lab result repeats and make decision.  An After Visit Summary was printed and given to the patient.   FOLLOW UP: Return in about 6 weeks (around 04/04/2015) for smoking cessation counseling (30  min).

## 2015-02-24 LAB — PATHOLOGIST SMEAR REVIEW

## 2015-02-27 ENCOUNTER — Other Ambulatory Visit: Payer: Self-pay | Admitting: Family Medicine

## 2015-02-27 DIAGNOSIS — D75839 Thrombocytosis, unspecified: Secondary | ICD-10-CM

## 2015-02-27 DIAGNOSIS — D72829 Elevated white blood cell count, unspecified: Secondary | ICD-10-CM

## 2015-02-27 DIAGNOSIS — Z52001 Unspecified donor, stem cells: Secondary | ICD-10-CM

## 2015-02-27 DIAGNOSIS — D473 Essential (hemorrhagic) thrombocythemia: Secondary | ICD-10-CM

## 2015-03-05 HISTORY — PX: OTHER SURGICAL HISTORY: SHX169

## 2015-03-13 ENCOUNTER — Telehealth: Payer: Self-pay

## 2015-03-13 NOTE — Telephone Encounter (Signed)
LVM to call back and confirm New Pt appt.

## 2015-03-13 NOTE — Telephone Encounter (Signed)
Pt called back to confirm new pt appt.

## 2015-04-01 ENCOUNTER — Encounter: Payer: Self-pay | Admitting: Family Medicine

## 2015-04-04 ENCOUNTER — Emergency Department (HOSPITAL_BASED_OUTPATIENT_CLINIC_OR_DEPARTMENT_OTHER)
Admission: EM | Admit: 2015-04-04 | Discharge: 2015-04-04 | Disposition: A | Discharge status: Home / Self Care | Payer: Managed Care, Other (non HMO) | Attending: Emergency Medicine | Admitting: Emergency Medicine

## 2015-04-04 ENCOUNTER — Encounter (HOSPITAL_BASED_OUTPATIENT_CLINIC_OR_DEPARTMENT_OTHER): Payer: Self-pay | Admitting: Emergency Medicine

## 2015-04-04 ENCOUNTER — Emergency Department (HOSPITAL_BASED_OUTPATIENT_CLINIC_OR_DEPARTMENT_OTHER): Payer: Managed Care, Other (non HMO)

## 2015-04-04 DIAGNOSIS — S99921A Unspecified injury of right foot, initial encounter: Secondary | ICD-10-CM | POA: Diagnosis present

## 2015-04-04 DIAGNOSIS — Z720 Tobacco use: Secondary | ICD-10-CM | POA: Diagnosis not present

## 2015-04-04 DIAGNOSIS — H9192 Unspecified hearing loss, left ear: Secondary | ICD-10-CM | POA: Diagnosis not present

## 2015-04-04 DIAGNOSIS — E039 Hypothyroidism, unspecified: Secondary | ICD-10-CM | POA: Diagnosis not present

## 2015-04-04 DIAGNOSIS — Z8659 Personal history of other mental and behavioral disorders: Secondary | ICD-10-CM | POA: Insufficient documentation

## 2015-04-04 DIAGNOSIS — W1839XA Other fall on same level, initial encounter: Secondary | ICD-10-CM | POA: Insufficient documentation

## 2015-04-04 DIAGNOSIS — Z8719 Personal history of other diseases of the digestive system: Secondary | ICD-10-CM | POA: Diagnosis not present

## 2015-04-04 DIAGNOSIS — S93602A Unspecified sprain of left foot, initial encounter: Secondary | ICD-10-CM | POA: Diagnosis not present

## 2015-04-04 DIAGNOSIS — S99912A Unspecified injury of left ankle, initial encounter: Secondary | ICD-10-CM | POA: Diagnosis not present

## 2015-04-04 DIAGNOSIS — Z8541 Personal history of malignant neoplasm of cervix uteri: Secondary | ICD-10-CM | POA: Diagnosis not present

## 2015-04-04 DIAGNOSIS — M1990 Unspecified osteoarthritis, unspecified site: Secondary | ICD-10-CM | POA: Diagnosis not present

## 2015-04-04 DIAGNOSIS — Z52001 Unspecified donor, stem cells: Secondary | ICD-10-CM | POA: Diagnosis not present

## 2015-04-04 DIAGNOSIS — Y92009 Unspecified place in unspecified non-institutional (private) residence as the place of occurrence of the external cause: Secondary | ICD-10-CM | POA: Insufficient documentation

## 2015-04-04 DIAGNOSIS — S3992XA Unspecified injury of lower back, initial encounter: Secondary | ICD-10-CM | POA: Insufficient documentation

## 2015-04-04 DIAGNOSIS — M797 Fibromyalgia: Secondary | ICD-10-CM | POA: Insufficient documentation

## 2015-04-04 DIAGNOSIS — Y9389 Activity, other specified: Secondary | ICD-10-CM | POA: Insufficient documentation

## 2015-04-04 DIAGNOSIS — Z86010 Personal history of colonic polyps: Secondary | ICD-10-CM | POA: Insufficient documentation

## 2015-04-04 DIAGNOSIS — S93601A Unspecified sprain of right foot, initial encounter: Secondary | ICD-10-CM

## 2015-04-04 DIAGNOSIS — Z79899 Other long term (current) drug therapy: Secondary | ICD-10-CM | POA: Insufficient documentation

## 2015-04-04 DIAGNOSIS — W19XXXA Unspecified fall, initial encounter: Secondary | ICD-10-CM

## 2015-04-04 DIAGNOSIS — G4700 Insomnia, unspecified: Secondary | ICD-10-CM | POA: Diagnosis not present

## 2015-04-04 DIAGNOSIS — Y998 Other external cause status: Secondary | ICD-10-CM | POA: Insufficient documentation

## 2015-04-04 MED ORDER — OXYCODONE-ACETAMINOPHEN 5-325 MG PO TABS
2.0000 | ORAL_TABLET | Freq: Once | ORAL | Status: AC
  Administered 2015-04-04: 2 via ORAL
  Filled 2015-04-04: qty 2

## 2015-04-04 MED ORDER — METHOCARBAMOL 500 MG PO TABS: 500.0000 mg | ORAL_TABLET | Freq: Three times a day (TID) | ORAL | Status: DC | PRN

## 2015-04-04 MED ORDER — OXYCODONE-ACETAMINOPHEN 5-325 MG PO TABS: 2.0000 | ORAL_TABLET | ORAL | Status: DC | PRN

## 2015-04-04 MED ORDER — ONDANSETRON 4 MG PO TBDP
4.0000 mg | ORAL_TABLET | Freq: Once | ORAL | Status: AC
  Administered 2015-04-04: 4 mg via ORAL
  Filled 2015-04-04: qty 1

## 2015-04-04 NOTE — ED Notes (Signed)
All requested information gathered [facility] to [facility] as requested.

## 2015-04-04 NOTE — ED Notes (Signed)
Patient to room 2 in wheelchair, states that yesterday morning she was walking down the steps and she "missed" the last step, doesn't remember how she landed. Patient states that she did not hit head, no LOC. Patient is not able to put pressure on either feet.

## 2015-04-04 NOTE — ED Notes (Signed)
Upon arrival pt was assisted out or car and into w/c by staff-prior to traige pt states she has to void, pt assisted to [facility] via/ w/c

## 2015-04-04 NOTE — ED Provider Notes (Signed)
CSN: [id number]     Arrival date & time 04/04/15  73 History   First MD Initiated Contact with Patient 04/04/15 1159     Chief Complaint  Patient presents with  . Fall      HPI  Patient presents for evaluation after a fall at home last night. Hasn't pain in her right low back primarily pain in her left ankle and right foot. States that she "missed the bottom step" and has pain to her left ankle and right foot. She cannot bear weight without significant pain and marked limp this morning.  Past Medical History  Diagnosis Date  . Osteoarthritis     Thumbs and low back (MRI L spine 09/2013 DDD with facet arthritis, no nerve impingement or spinal stenosis)  . OCD (obsessive compulsive disorder) 1990s    Dx'd by psychiatrist in [place]  . Hx of adenomatous colonic polyps     Pt says first at age 36.  Most recent colonoscopy 2013 showed no polyps.  Recall 5 yrs.  . Hypothyroidism Approx 2007  . Cervical cancer 1990's    LEEP 1990s  . Fibromyalgia     Per old records, rheum w/u neg in past but and pt says she was given dx of fibromyalgia.  Neurontin 1200-1800 mg/day helped initially but she stopped it after it stopped helping (she self d/c'd it 10/2013)  . History of depression   . Insomnia   . Tobacco dependence   . Chronic constipation   . Left ear hearing loss Longstanding  . 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 Family history of colon cancer    Past Surgical History  Procedure Laterality Date  . Cholecystectomy  1991  . Tonsillectomy and adenoidectomy  1981  . Colonoscopy  first at age 23, most recent 07/2012    Has had 6-7 of these, all with polypectomies except most recent one done 07/2012.  Repeat after 07/2017.  . Dilation and curettage of uterus    . Leep  1990s   Family History  Problem Relation Age of Onset  . CAD Mother     and father  . Multiple sclerosis Father     d. age 10  . Diabetes Mellitus II Father   . Anxiety disorder Mother   . COPD Mother     d of ruptured  SB    Social History  Substance Use Topics  . Smoking status: Current Every Day Smoker  . Smokeless tobacco: Never Used  . Alcohol Use: Yes   OB History    No data available     Review of Systems  Constitutional: Negative for fever, chills, diaphoresis, appetite change and fatigue.  HENT: Negative for mouth sores, sore throat and trouble swallowing.   Eyes: Negative for visual disturbance.  Respiratory: Negative for cough, chest tightness, shortness of breath and wheezing.   Cardiovascular: Negative for chest pain.  Gastrointestinal: Negative for nausea, vomiting, abdominal pain, diarrhea and abdominal distention.  Endocrine: Negative for polydipsia, polyphagia and polyuria.  Genitourinary: Negative for dysuria, frequency and hematuria.  Musculoskeletal: Positive for back pain, arthralgias and gait problem.  Skin: Negative for color change, pallor and rash.  Neurological: Negative for dizziness, syncope, light-headedness and headaches.  Hematological: Does not bruise/bleed easily.  Psychiatric/Behavioral: Negative for behavioral problems and confusion.      Allergies  Erythromycin and Sudafed  Home Medications   Prior to Admission medications   Medication Sig Start Date End Date Taking? Authorizing Provider  gabapentin (NEURONTIN) 300 MG capsule  Take 300 mg by mouth. Taking 4 cap at bedtime as needed   Yes Historical Provider, MD  Levothyroxine Sodium 75 MCG CAPS Take 1 capsule (75 mcg total) by mouth daily before breakfast. 06/14/14  Yes Tammi Sou, MD  Multiple Vitamins-Minerals (CENTRUM ADULTS PO) Take by mouth daily.   Yes Historical Provider, MD  PARoxetine (PAXIL) 20 MG tablet Take 1 tablet (20 mg total) by mouth daily. Patient taking differently: Take 10 mg by mouth daily.  04/30/14  Yes Tammi Sou, MD  albuterol (VENTOLIN HFA) 108 (90 BASE) MCG/ACT inhaler Inhale 1-2 puffs into the lungs every 4 (four) hours as needed for wheezing or shortness of  breath. Patient not taking: Reported on 02/21/2015 01/22/15   Tammi Sou, MD  cyclobenzaprine (FLEXERIL) 10 MG tablet Take 10 mg by mouth 2 (two) times daily.    Historical Provider, MD  diazepam (VALIUM) 10 MG tablet Take 10 mg by mouth 3 (three) times daily.    Historical Provider, MD  methocarbamol (ROBAXIN) 500 MG tablet Take 1 tablet (500 mg total) by mouth 3 (three) times daily between meals as needed. 04/04/15   Tanna Furry, MD  oxyCODONE-acetaminophen (PERCOCET/ROXICET) 5-325 MG per tablet Take 2 tablets by mouth every 4 (four) hours as needed. 04/04/15   Tanna Furry, MD   BP 125/86 mmHg  Pulse 77  Temp(Src) 98 F (36.7 C) (Oral)  Resp 18  Ht 5\' 6"  (1.676 m)  Wt 157 lb (71.215 kg)  BMI 25.35 kg/m2  SpO2 100% Physical Exam  Constitutional: She is oriented to person, place, and time. She appears well-developed and well-nourished. No distress.  HENT:  Head: Normocephalic.  Eyes: Conjunctivae are normal. Pupils are equal, round, and reactive to light. No scleral icterus.  Neck: Normal range of motion. Neck supple. No thyromegaly present.  Cardiovascular: Normal rate and regular rhythm.  Exam reveals no gallop and no friction rub.   No murmur heard. Pulmonary/Chest: Effort normal and breath sounds normal. No respiratory distress. She has no wheezes. She has no rales.  Abdominal: Soft. Bowel sounds are normal. She exhibits no distension. There is no tenderness. There is no rebound.  Musculoskeletal: Normal range of motion.       Back:       Legs:      Feet:  Neurological: She is alert and oriented to person, place, and time.  Skin: Skin is warm and dry. No rash noted.  Psychiatric: She has a normal mood and affect. Her behavior is normal.    ED Course  Procedures (including critical care time) Labs Review Labs Reviewed - No data to display  Imaging Review Dg Ankle Complete Left  04/04/2015   CLINICAL DATA:  [place] down steps yesterday  EXAM: LEFT ANKLE COMPLETE - 3+ VIEW   COMPARISON:  None.  FINDINGS: Three views of the left ankle submitted. No acute fracture or subluxation. No radiopaque foreign body. Ankle mortise is preserved. Soft tissue swelling adjacent to lateral malleolus.  IMPRESSION: No acute fracture or subluxation.  Lateral soft tissue swelling.   Electronically Signed   By: Lahoma Crocker M.D.   On: 04/04/2015 13:01   Dg Ankle Complete Right  04/04/2015   CLINICAL DATA:  [place] down steps yesterday  EXAM: RIGHT ANKLE - COMPLETE 3+ VIEW  COMPARISON:  None.  FINDINGS: Three views of the right ankle submitted. No acute fracture or subluxation. No radiopaque foreign body. Ankle mortise is preserved. Mild lateral soft tissue swelling.  IMPRESSION: No acute  fracture or subluxation. Mild lateral soft tissue swelling.   Electronically Signed   By: Lahoma Crocker M.D.   On: 04/04/2015 13:02   Dg Foot Complete Left  04/04/2015   CLINICAL DATA:  Fall down steps yesterday, bilateral metatarsal pain and swelling  EXAM: LEFT FOOT - COMPLETE 3+ VIEW  COMPARISON:  None.  FINDINGS: Three views of the left foot submitted. No acute fracture or subluxation. No radiopaque foreign body.  IMPRESSION: Negative.   Electronically Signed   By: Lahoma Crocker M.D.   On: 04/04/2015 13:07   Dg Foot Complete Right  04/04/2015   CLINICAL DATA:  Fall down steps yesterday  EXAM: RIGHT FOOT COMPLETE - 3+ VIEW  COMPARISON:  05/16/2013  FINDINGS: Three views of the right foot submitted. No acute fracture or subluxation. No radiopaque foreign body.  IMPRESSION: Negative.   Electronically Signed   By: Lahoma Crocker M.D.   On: 04/04/2015 13:07   I have personally reviewed and evaluated these images and lab results as part of my medical decision-making.   EKG Interpretation None      MDM   Final diagnoses:  Right foot sprain, initial encounter  Foot sprain, left, initial encounter    No fractures. Ace wrap is placed. Cam Walker placed on her left foot. I think alteplase she will be able to weight-bear  with her cam walker and crutches or walker. However for today and feels she will need to be transfer only. I discussed her situation with her care manager. Orders placed for home as patient therapy evaluation. Home wheelchair and further evaluation for needs by home occupational therapy. This plan was discussed at length with patient. Given prescription for by mouth pain meds. Will ice elevate her feet. Ace wrap to reduce swelling. Primary care follow-up as needed.    Tanna Furry, MD 04/04/15 [id number]

## 2015-04-04 NOTE — Discharge Instructions (Signed)
Wheelchair until able to walk using crutches and boot. Home health care including physical therapy, and wheelchair has been requested. Ace wrap to limit swelling. Apply ice intermittently and elevate your feet.  Sprain A sprain is a tear in one of the strong, fibrous tissues that connect your bones (ligaments). The severity of the sprain depends on how much of the ligament is torn. The tear can be either partial or complete. CAUSES  Often, sprains are a result of a fall or an injury. The force of the impact causes the fibers of your ligament to stretch beyond their normal length. This excess tension causes the fibers of your ligament to tear. SYMPTOMS  You may have some loss of motion or increased pain within your normal range of motion. Other symptoms include:  Bruising.  Tenderness.  Swelling. DIAGNOSIS  In order to diagnose a sprain, your caregiver will physically examine you to determine how torn the ligament is. Your caregiver may also suggest an X-ray exam to make sure no bones are broken. TREATMENT  If your ligament is only partially torn, treatment usually involves keeping the injured area in a fixed position (immobilization) for a short period. To do this, your caregiver will apply a bandage, cast, or splint to keep the area from moving until it heals. For a partially torn ligament, the healing process usually takes 2 to 3 weeks. If your ligament is completely torn, you may need surgery to reconnect the ligament to the bone or to reconstruct the ligament. After surgery, a cast or splint may be applied and will need to stay on for 4 to 6 weeks while your ligament heals. HOME CARE INSTRUCTIONS  Keep the injured area elevated to decrease swelling.  To ease pain and swelling, apply ice to your joint twice a day, for 2 to 3 days.  Put ice in a plastic bag.  Place a towel between your skin and the bag.  Leave the ice on for 15 minutes.  Only take over-the-counter or prescription  medicine for pain as directed by your caregiver.  Do not leave the injured area unprotected until pain and stiffness go away (usually 3 to 4 weeks).  Do not allow your cast or splint to get wet. Cover your cast or splint with a plastic bag when you shower or bathe. Do not swim.  Your caregiver may suggest exercises for you to do during your recovery to prevent or limit permanent stiffness. SEEK IMMEDIATE MEDICAL CARE IF:  Your cast or splint becomes damaged.  Your pain becomes worse. MAKE SURE YOU:  Understand these instructions.  Will watch your condition.  Will get help right away if you are not doing well or get worse. Document Released: 07/30/2000 Document Revised: 10/25/2011 Document Reviewed: 08/14/2011 [facility] Patient Information 2015 [place], [place]. This information is not intended to replace advice given to you by your health care provider. Make sure you discuss any questions you have with your health care provider.

## 2015-04-04 NOTE — ED Notes (Signed)
[facility] called by this rn, spoke with Barbaraann Rondo who states I need to fax a chart note stating that pt is nwb bilateral le, and face to face stating she needs a standard w/c with elevated legs and anti tippers. Also need to fax demographic sheet with insurance information and height/weight to [phone]. Pt will have to pick up wheelchair at dme store, this item is not deliverable per rodney.

## 2015-04-04 NOTE — ED Notes (Signed)
Patient transported to X-ray 

## 2015-04-04 NOTE — Care Management Note (Addendum)
Case Management Note  Patient Details  Name: Erica King MRN: [id number] Date of Birth: [birth year]  Subjective/Objective:                  54 yo female states that yesterday morning she was walking down the steps and she "missed" the last step, doesn't remember how she landed. //Home with spouse.  Action/Plan: Set up disposition needs  Expected Discharge Date:      04/04/15            Expected Discharge Plan:  [place]  In-House Referral:  NA  Discharge planning Services  CM Consult  Post Acute Care Choice:  Durable Medical Equipment, Home Health Choice offered to:  Patient  DME Arranged:  Wheelchair manual DME Agency:  [place]:  PT, OT [facility] Agency:  Other - See comment  Status of Service:  Completed, signed off  Medicare Important Message Given:    Date Medicare IM Given:    Medicare IM give by:    Date Additional Medicare IM Given:    Additional Medicare Important Message give by:     If discussed at [place] of Stay Meetings, dates discussed:    Additional Comments: Anielle Headrick J. Clydene Laming, RN, BSN, [employer] [phone] Spoke with EDP regarding discharge planning for [facility]. Offered pt list of home health agencies to choose from.  Pt chose [facility] to render services. Rhett Bannister of [facility] notified.   DME needs identified at this time include wheelchair.  Rhett Bannister will secure wheelchair for patient.  Fuller Mandril, RN 04/04/2015, 1:41 PM

## 2015-04-08 ENCOUNTER — Ambulatory Visit (HOSPITAL_BASED_OUTPATIENT_CLINIC_OR_DEPARTMENT_OTHER): Payer: Managed Care, Other (non HMO) | Admitting: Hematology & Oncology

## 2015-04-08 ENCOUNTER — Encounter: Payer: Self-pay | Admitting: Hematology & Oncology

## 2015-04-08 ENCOUNTER — Other Ambulatory Visit (HOSPITAL_BASED_OUTPATIENT_CLINIC_OR_DEPARTMENT_OTHER): Payer: Managed Care, Other (non HMO)

## 2015-04-08 ENCOUNTER — Ambulatory Visit: Payer: Managed Care, Other (non HMO)

## 2015-04-08 VITALS — BP 138/71 | HR 81 | Temp 98.0°F | Resp 18 | Ht 66.0 in | Wt 160.0 lb

## 2015-04-08 DIAGNOSIS — D473 Essential (hemorrhagic) thrombocythemia: Secondary | ICD-10-CM

## 2015-04-08 DIAGNOSIS — M797 Fibromyalgia: Secondary | ICD-10-CM

## 2015-04-08 DIAGNOSIS — D72829 Elevated white blood cell count, unspecified: Secondary | ICD-10-CM

## 2015-04-08 DIAGNOSIS — Z800 Family history of malignant neoplasm of digestive organs: Secondary | ICD-10-CM

## 2015-04-08 DIAGNOSIS — Z720 Tobacco use: Secondary | ICD-10-CM

## 2015-04-08 LAB — CBC WITH DIFFERENTIAL (CANCER CENTER ONLY)
BASO#: 0.1 10*3/uL (ref 0.0–0.2)
BASO%: 0.4 % (ref 0.0–2.0)
EOS%: 1.4 % (ref 0.0–7.0)
Eosinophils Absolute: 0.2 10*3/uL (ref 0.0–0.5)
HCT: 42.3 % (ref 34.8–46.6)
HGB: 14.3 g/dL (ref 11.6–15.9)
LYMPH#: 2.7 10*3/uL (ref 0.9–3.3)
LYMPH%: 20.6 % (ref 14.0–48.0)
MCH: 31.8 pg (ref 26.0–34.0)
MCHC: 33.8 g/dL (ref 32.0–36.0)
MCV: 94 fL (ref 81–101)
MONO#: 0.8 10*3/uL (ref 0.1–0.9)
MONO%: 6 % (ref 0.0–13.0)
NEUT#: 9.4 10*3/uL — ABNORMAL HIGH (ref 1.5–6.5)
NEUT%: 71.6 % (ref 39.6–80.0)
PLATELETS: 440 10*3/uL — AB (ref 145–400)
RBC: 4.49 10*6/uL (ref 3.70–5.32)
RDW: 13.8 % (ref 11.1–15.7)
WBC: 13.2 10*3/uL — AB (ref 3.9–10.0)

## 2015-04-08 LAB — CHCC SATELLITE - SMEAR

## 2015-04-08 NOTE — Progress Notes (Signed)
Referral MD  Reason for Referral: Chronic benign thrombocytosis and leukocytosis   Chief Complaint  Patient presents with  . OTHER    New Patient  : I'm under a lot of stress and it is affecting my blood counts.  HPI: Erica King is a very nice 54 year old white female. She has quite a few medical problems. Her B's medical condition is fibromyalgia. She also has some psychological illnesses.  She is the youngest of 8 children. She actually was a bone marrow donor for her brother who died of acute leukemia.  She has obsessive compulsive disorder. She has hypothyroidism.  She does smoke. She smoked for many years.  She brought in some lab work that she had done back in 2010. This is part of the bone marrow transplant for her brother. At that point, her white cell count was 13,500. Her plate count was 341,962.  She recently has had lab work done by her family doctor. This showed a mild leukocytosis. She had a white cell count of 12,500.  her platelet count was elevated at 440,000.  Dr. Anitra Lauth felt that a hematologic evaluation was indicated and he calmly referred her to the [facility].  She recently tore some tendons in her ankles. She has walking boots on. Shockingly enough, she has not gone to an orthopedist. These were put on by herself and her husband. She did go to the emergency room here at [facility]. She feels that she is able to take care of this herself. This happened about a week ago.  She gets her mammograms routinely. She's had multiple colonoscopies because of polyps.  She's had no problems with weight loss or weight gain area and she's had no rashes. She does like to be on the sun quite a bit. She is quite tan.  She's had no cough. There's been no hemoptysis.  She's not felt any swollen lymph glands.   Past Medical History  Diagnosis Date  . Osteoarthritis     Thumbs and low back (MRI L spine 09/2013 DDD with facet arthritis, no nerve  impingement or spinal stenosis)  . OCD (obsessive compulsive disorder) 1990s    Dx'd by psychiatrist in [place]  . Hx of adenomatous colonic polyps     Pt says first at age 36.  Most recent colonoscopy 2013 showed no polyps.  Recall 5 yrs.  . Hypothyroidism Approx 2007  . Cervical cancer 1990's    LEEP 1990s  . Fibromyalgia     Per old records, rheum w/u neg in past but and pt says she was given dx of fibromyalgia.  Neurontin 1200-1800 mg/day helped initially but she stopped it after it stopped helping (she self d/c'd it 10/2013)  . History of depression   . Insomnia   . Tobacco dependence   . Chronic constipation   . Left ear hearing loss Longstanding  . 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 Family history of colon cancer   :  Past Surgical History  Procedure Laterality Date  . Cholecystectomy  1991  . Tonsillectomy and adenoidectomy  1981  . Colonoscopy  first at age 5, most recent 07/2012    Has had 6-7 of these, all with polypectomies except most recent one done 07/2012.  Repeat after 07/2017.  . Dilation and curettage of uterus    . Leep  1990s  :   Current outpatient prescriptions:  .  clonazePAM (KLONOPIN) 0.5 MG tablet, TAKE ONE OR TWO TABLETS BY MOUTH AT  BEDTIME, Disp: , Rfl: 0 .  gabapentin (NEURONTIN) 300 MG capsule, Take 300 mg by mouth. Taking 4 cap at bedtime as needed, Disp: , Rfl:  .  Levothyroxine Sodium 75 MCG CAPS, Take 1 capsule (75 mcg total) by mouth daily before breakfast., Disp: 90 capsule, Rfl: 3 .  methocarbamol (ROBAXIN) 500 MG tablet, Take 1 tablet (500 mg total) by mouth 3 (three) times daily between meals as needed., Disp: 20 tablet, Rfl: 0 .  Multiple Vitamins-Minerals (CENTRUM ADULTS PO), Take by mouth daily., Disp: , Rfl:  .  oxyCODONE-acetaminophen (PERCOCET/ROXICET) 5-325 MG per tablet, Take 2 tablets by mouth every 4 (four) hours as needed., Disp: 30 tablet, Rfl: 0 .  PARoxetine (PAXIL) 10 MG tablet, , Disp: , Rfl: :  :  Allergies  Allergen  Reactions  . Erythromycin Other (See Comments)    Thrush  . Sudafed [Pseudoephedrine Hcl] Other (See Comments)    Heart Races  :  Family History  Problem Relation Age of Onset  . CAD Mother     and father  . Multiple sclerosis Father     d. age 71  . Diabetes Mellitus II Father   . Anxiety disorder Mother   . COPD Mother     d of ruptured SB   :  Social History   Social History  . Marital Status: Married    Spouse Name: N/A  . Number of Children: N/A  . Years of Education: N/A   Occupational History  . Not on file.   Social History Main Topics  . Smoking status: Current Every Day Smoker  . Smokeless tobacco: Never Used  . Alcohol Use: 0.0 oz/week    0 Standard drinks or equivalent per week  . Drug Use: Yes    Special: Marijuana  . Sexual Activity: Not on file   Other Topics Concern  . Not on file   Social History Narrative   Married, 2 adult children, has grandchildren.  She has 7 sibs.   Orig from [place], relocated to [facility] 08/2012.   Educ: HS and some college.   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   Tobacco 10 pack-yr hx.  Smokes marijuana occasionally, denies use of any other illegal drugs.  Alcohol: none.           :  Pertinent items are noted in HPI.  Exam: @IPVITALS @  well-developed and well-nourished white female in no obvious distress. Vital signs were temperature of 98. Pulse 81. Blood pressure 138/71. Weight is 60 pounds. Head and neck exam shows no ocular or oral lesions. She has no palpable cervical or supraclavicular lymph nodes. Thyroid is nonpalpable. Lungs are clear. Cardiac exam regular rate and rhythm with a normal S1 and S2. There are no murmurs, rubs or bruits. Axillary exam shows no bilateral axillary adenopathy. Abdomen is soft. She has good bowel sounds. She has a laparotomy scar in the right upper quadrant from a cholecystectomy. There is no palpable liver or spleen tip. Back exam shows no  tenderness over the spine, ribs or hips. Extremities shows no clubbing, cyanosis or edema. Neurological exam shows no focal neurological deficits. Skin exam shows no rashes, ecchymoses or petechia.    Recent Labs  04/08/15 0857  WBC 13.2*  HGB 14.3  HCT 42.3  PLT 440*   No results for input(s): NA, K, CL, CO2, GLUCOSE, BUN, CREATININE, CALCIUM in the last 72 hours.  Blood smear review: Normochromic and normocytic population of red  blood cells. She has no nucleated red blood cells. There are no teardrop cells. There is no target cells. White cells been normal in morphology maturation. She might have a slightly increased level of white cells. White cells are mature. There are no hyper segmented polys. She has some increased granulation of the white cells. There is no atypical lymphocytes. Platelets are mildly increased number. Platelets are fairly uniform in size.  Platelets are well granulated.  Pathology: None     Assessment and Plan:  Erica King is a very nice 54 year old white female. She has mild leukocytosis and thrombocytosis. This is begun on for about 6 years.  I can find nothing on her physical exam that would suggest a hematologic issue. I find nothing that would suggest a myeloproliferative neoplasm. As such, I don't think we have to send her blood off for genetic testing.  I strongly believe that everything that is going on is reactive. Her white cells, with increased granularity are suggestive of chronic inflammatory conditions.  The fact that she smokes, also increases the risk of leukocytosis.  I am quite reassured that given the fact that her white cell count was elevated 6 years ago, that she probably has a "new normal" in which her white cell count and platelets probably do trend slightly above normal.  I had a great time talking with her and her husband.  I spent about 45 minutes with them.  I don't think we have to get her back to the office. I still see that we  will be adding to her medical care. She is getting fantastic care from Dr. Anitra Lauth who she likes quite a bit.  I told her that we certainly could see her back in the future if anything else arises.

## 2015-05-05 ENCOUNTER — Encounter: Payer: Self-pay | Admitting: Family Medicine

## 2015-05-05 ENCOUNTER — Ambulatory Visit (INDEPENDENT_AMBULATORY_CARE_PROVIDER_SITE_OTHER): Payer: Managed Care, Other (non HMO) | Admitting: Family Medicine

## 2015-05-05 VITALS — BP 112/80 | HR 88 | Temp 98.2°F | Resp 16 | Ht 66.0 in | Wt 162.0 lb

## 2015-05-05 DIAGNOSIS — F17200 Nicotine dependence, unspecified, uncomplicated: Secondary | ICD-10-CM | POA: Diagnosis not present

## 2015-05-05 DIAGNOSIS — M797 Fibromyalgia: Secondary | ICD-10-CM

## 2015-05-05 DIAGNOSIS — S93401D Sprain of unspecified ligament of right ankle, subsequent encounter: Secondary | ICD-10-CM | POA: Diagnosis not present

## 2015-05-05 DIAGNOSIS — Z52001 Unspecified donor, stem cells: Secondary | ICD-10-CM

## 2015-05-05 DIAGNOSIS — S93402D Sprain of unspecified ligament of left ankle, subsequent encounter: Secondary | ICD-10-CM | POA: Diagnosis not present

## 2015-05-05 NOTE — Progress Notes (Signed)
OFFICE VISIT  05/05/2015   CC:  Chief Complaint  Patient presents with  . Follow-up    ER visit on 04/04/15 due to ankle injury on 04/03/15  . Nicotine Dependence    ready to quit   HPI:    Patient is a 54 y.o. Caucasian female who presents for f/u of bilat ankle sprains on 04/04/15. She wore soft cast boots and used a walker for a while. She is still having some pain and feeling of instability R>L.  She [facility] be [facility] [place] so she can get PT for her ankles as well as possibly PT for her fibromyalgia.  She says she has looked into it and says they accept her insurance.  Wants to quit smoking.  Has smoked for years, quit for a 3 year period 2011-2014.  Restarted and smokes 1/2-1 pack per day of light cigs.   She is interested in nicotine replacement.   Past Medical History  Diagnosis Date  . Osteoarthritis     Thumbs and low back (MRI L spine 09/2013 DDD with facet arthritis, no nerve impingement or spinal stenosis)  . OCD (obsessive compulsive disorder) 1990s    Dx'd by psychiatrist in [place]  . Hx of adenomatous colonic polyps     Pt says first at age 43.  Most recent colonoscopy 2013 showed no polyps.  Recall 5 yrs.  . Hypothyroidism Approx 2007  . Cervical cancer 1990's    LEEP 1990s  . Fibromyalgia     Per old records, rheum w/u neg in past but and pt says she was given dx of fibromyalgia.  Neurontin 1200-1800 mg/day helped initially but she stopped it after it stopped helping (she self d/c'd it 10/2013)  . History of depression   . Insomnia   . Tobacco dependence   . Chronic constipation   . Left ear hearing loss Longstanding  . 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 Family history of colon cancer   . Ankle sprain bilat 04/03/18    x-rays neg    Past Surgical History  Procedure Laterality Date  . Cholecystectomy  1991  . Tonsillectomy and adenoidectomy  1981  . Colonoscopy  first at age 73, most recent 07/2012    Has had 6-7 of these, all with  polypectomies except most recent one done 07/2012.  Repeat after 07/2017.  . Dilation and curettage of uterus    . Leep  1990s    Outpatient Prescriptions Prior to Visit  Medication Sig Dispense Refill  . gabapentin (NEURONTIN) 300 MG capsule Take 300 mg by mouth. Taking 4 cap at bedtime as needed    . Levothyroxine Sodium 75 MCG CAPS Take 1 capsule (75 mcg total) by mouth daily before breakfast. 90 capsule 3  . Multiple Vitamins-Minerals (CENTRUM ADULTS PO) Take by mouth daily.    Marland Kitchen PARoxetine (PAXIL) 10 MG tablet     . clonazePAM (KLONOPIN) 0.5 MG tablet TAKE ONE OR TWO TABLETS BY MOUTH AT BEDTIME  0  . methocarbamol (ROBAXIN) 500 MG tablet Take 1 tablet (500 mg total) by mouth 3 (three) times daily between meals as needed. (Patient not taking: Reported on 05/05/2015) 20 tablet 0  . oxyCODONE-acetaminophen (PERCOCET/ROXICET) 5-325 MG per tablet Take 2 tablets by mouth every 4 (four) hours as needed. (Patient not taking: Reported on 05/05/2015) 30 tablet 0   No facility-administered medications prior to visit.    Allergies  Allergen Reactions  . Erythromycin Other (See Comments)  Thrush  . Sudafed [Pseudoephedrine Hcl] Other (See Comments)    Heart Races   ROS As per HPI  PE: Blood pressure 112/80, pulse 88, temperature 98.2 F (36.8 C), temperature source Oral, resp. rate 16, height 5\' 6"  (1.676 m), weight 162 lb (73.483 kg), SpO2 99 %. Gen: Alert, well appearing.  Patient is oriented to person, place, time, and situation. Ankles: no erythema or significant soft tissue swelling.  Mild TTP over lateral ankle tendons. No excess laxity with varus/valgus or with anterior drawer.  R >L ankle with pain with ROM.  ROM is intact bilat.  LABS:  None today  IMPRESSION AND PLAN:  1) Bilat severe ankle sprains; refer to PT, wear soft, lace-up ankle supports (she has these at home).  2) Tobacco dependence counseling: spent > 10 min counseling pt on this, decided to avoid chantix. No  wellbutrin at this time since she is already on paxil (and says her psychiatrist wants to ween her off of this). Decided to do nicotine replacement:  Instructions:  Buy 14 mg nicotine patch and apply one per day for 1 month. Then, use 7 mg patch once daily for 1 month.  For cravings, use nicotine gum or lozenge as needed.   FOLLOW UP: Return in about 6 weeks (around 06/16/2015).

## 2015-05-05 NOTE — Progress Notes (Signed)
Pre visit review using our clinic review tool, if applicable. No additional management support is needed unless otherwise documented below in the visit note. 

## 2015-05-05 NOTE — Patient Instructions (Signed)
Buy 14 mg nicotine patch and apply one per day for 1 month. Then, use 7 mg patch once daily for 1 month.  For cravings, use nicotine gum or lozenge as needed.

## 2015-06-18 ENCOUNTER — Ambulatory Visit: Payer: Managed Care, Other (non HMO) | Admitting: Family Medicine

## 2015-07-15 ENCOUNTER — Encounter: Payer: Self-pay | Admitting: Family Medicine

## 2015-07-15 ENCOUNTER — Ambulatory Visit (INDEPENDENT_AMBULATORY_CARE_PROVIDER_SITE_OTHER): Payer: Managed Care, Other (non HMO) | Admitting: Family Medicine

## 2015-07-15 VITALS — BP 144/80 | HR 80 | Temp 98.5°F | Resp 16 | Ht 66.0 in | Wt 166.0 lb

## 2015-07-15 DIAGNOSIS — Z23 Encounter for immunization: Secondary | ICD-10-CM | POA: Diagnosis not present

## 2015-07-15 DIAGNOSIS — G4700 Insomnia, unspecified: Secondary | ICD-10-CM | POA: Diagnosis not present

## 2015-07-15 DIAGNOSIS — M797 Fibromyalgia: Secondary | ICD-10-CM

## 2015-07-15 DIAGNOSIS — F411 Generalized anxiety disorder: Secondary | ICD-10-CM

## 2015-07-15 DIAGNOSIS — E039 Hypothyroidism, unspecified: Secondary | ICD-10-CM

## 2015-07-15 MED ORDER — CYCLOBENZAPRINE HCL 10 MG PO TABS: 10.0000 mg | ORAL_TABLET | Freq: Three times a day (TID) | ORAL | Status: DC | PRN

## 2015-07-15 MED ORDER — ZOLPIDEM TARTRATE 5 MG PO TABS: ORAL_TABLET | ORAL | Status: DC

## 2015-07-15 MED ORDER — LEVOTHYROXINE SODIUM 75 MCG PO CAPS: 75.0000 ug | ORAL_CAPSULE | Freq: Every day | ORAL | Status: DC

## 2015-07-15 NOTE — Progress Notes (Signed)
OFFICE VISIT  07/25/2015   CC:  Chief Complaint  Patient presents with  . Follow-up    Pt is fasting.    HPI:    Patient is a 54 y.o. Caucasian female who presents for f/u:  Very anxious today, mainly about son and potential his wife's possible miscarriage today. She is seeing a psychiatrist.   Says fibromyalgia is flaring: hurting everywhere: esp shoulders/traps, also low back from DDD. Asks for rf of flexeril as this has been helpful for muscle spasms in the past.  Has chronic insomnia: psychiatrist has her on 1200 mg neurontin qhs.   Has trouble initiating sleep: takes 2 hours to fall asleep.  Wakes up w/in 2 hours of going to sleep and has trouble sleeping at that time as well.  She has only taken OTC sleep aids in the past: no rx's. She quit smoking x 10d and then restarted due to stress.  Takes thyroid med daily on empty stomach, separate from other meds.  Past Medical History  Diagnosis Date  . Osteoarthritis     Thumbs and low back (MRI L spine 09/2013 DDD with facet arthritis, no nerve impingement or spinal stenosis)  . OCD (obsessive compulsive disorder) 1990s    Dx'd by psychiatrist in [place]  . Hx of adenomatous colonic polyps     Pt says first at age 85.  Most recent colonoscopy 2013 showed no polyps.  Recall 5 yrs.  . Hypothyroidism Approx 2007  . Cervical cancer ([place]) 1990's    LEEP 1990s  . Fibromyalgia     Per old records, rheum w/u neg in past but and pt says she was given dx of fibromyalgia.  Neurontin 1200-1800 mg/day helped initially but she stopped it after it stopped helping (she self d/c'd it 10/2013)  . History of depression   . Insomnia   . Tobacco dependence   . Chronic constipation   . Left ear hearing loss Longstanding  . 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 Family history of colon cancer   . Ankle sprain bilat 04/03/18    x-rays neg    Past Surgical History  Procedure Laterality Date  . Cholecystectomy  1991  . Tonsillectomy and adenoidectomy   1981  . Colonoscopy  first at age 89, most recent 07/2012    Has had 6-7 of these, all with polypectomies except most recent one done 07/2012.  Repeat after 07/2017.  . Dilation and curettage of uterus    . Leep  1990s    Outpatient Prescriptions Prior to Visit  Medication Sig Dispense Refill  . gabapentin (NEURONTIN) 300 MG capsule Take 300 mg by mouth. Taking 4 cap at bedtime as needed    . Multiple Vitamins-Minerals (CENTRUM ADULTS PO) Take by mouth daily.    Marland Kitchen PARoxetine (PAXIL) 10 MG tablet     . Levothyroxine Sodium 75 MCG CAPS Take 1 capsule (75 mcg total) by mouth daily before breakfast. 90 capsule 3   No facility-administered medications prior to visit.    Allergies  Allergen Reactions  . Erythromycin Other (See Comments)    Thrush  . Sudafed [Pseudoephedrine Hcl] Other (See Comments)    Heart Races    ROS As per HPI  PE: Blood pressure 144/80, pulse 80, temperature 98.5 F (36.9 C), temperature source Oral, resp. rate 16, height 5\' 6"  (1.676 m), weight 166 lb (75.297 kg), SpO2 99 %. Gen: Alert, well appearing.  Patient is oriented to person, place, time, and situation. AFFECT: nervous/anxious, crying at times.  No further exam today.  LABS:  Lab Results  Component Value Date   TSH 2.08 01/22/2015   Lab Results  Component Value Date   WBC 13.2* 04/08/2015   HGB 14.3 04/08/2015   HCT 42.3 04/08/2015   MCV 94 04/08/2015   PLT 440* 04/08/2015   Lab Results  Component Value Date   CREATININE 0.93 01/22/2015   BUN 15 01/22/2015   NA 139 01/22/2015   K 5.1 01/22/2015   CL 105 01/22/2015   CO2 28 01/22/2015   Lab Results  Component Value Date   ALT 12 01/22/2015   AST 15 01/22/2015   ALKPHOS 72 01/22/2015   BILITOT 0.5 01/22/2015   Lab Results  Component Value Date   CHOL 208* 01/22/2015   Lab Results  Component Value Date   HDL 48.40 01/22/2015   Lab Results  Component Value Date   LDLCALC 128* 01/22/2015   Lab Results  Component Value  Date   TRIG 156.0* 01/22/2015   Lab Results  Component Value Date   CHOLHDL 4 01/22/2015   IMPRESSION AND PLAN:  1) Fibromyalgia, with chronic lumbago as well.   RF'd flexeril, as this seems to help when she has a flare-up.  2) Anxiety/depression: having a bad day.  Currently seeing psychiatrist.  No changes made today. Gave pt emotional support, encouragement.  3) Insomnia; will do trial of ambien 5mg  qhs, increase to 10mg  qhs in 1 wk if needed. Therapeutic expectations and side effect profile of medication discussed today.  Patient's questions answered.  4) Hypothyroidism: TSH was great 6 mo ago.  Recheck this in 6 mo.  Flu vaccine given today.  An After Visit Summary was printed and given to the patient.  FOLLOW UP: Return in about 3 months (around 10/14/2015) for f/u fibromyalgia, insomnia.

## 2015-07-15 NOTE — Progress Notes (Signed)
Pre visit review using our clinic review tool, if applicable. No additional management support is needed unless otherwise documented below in the visit note. 

## 2015-07-23 ENCOUNTER — Other Ambulatory Visit: Payer: Self-pay | Admitting: *Deleted

## 2015-07-23 MED ORDER — LEVOTHYROXINE SODIUM 75 MCG PO TABS
75.0000 ug | ORAL_TABLET | Freq: Every day | ORAL | Status: DC
Start: 2015-07-23 — End: 2017-01-21

## 2015-09-18 ENCOUNTER — Encounter: Payer: Self-pay | Admitting: Family Medicine

## 2015-10-14 ENCOUNTER — Encounter: Payer: Self-pay | Admitting: Family Medicine

## 2015-10-14 ENCOUNTER — Ambulatory Visit (INDEPENDENT_AMBULATORY_CARE_PROVIDER_SITE_OTHER): Payer: Managed Care, Other (non HMO) | Admitting: Family Medicine

## 2015-10-14 VITALS — BP 129/85 | HR 77 | Temp 97.9°F | Resp 16 | Ht 66.0 in | Wt 168.2 lb

## 2015-10-14 DIAGNOSIS — G4700 Insomnia, unspecified: Secondary | ICD-10-CM | POA: Diagnosis not present

## 2015-10-14 DIAGNOSIS — M797 Fibromyalgia: Secondary | ICD-10-CM

## 2015-10-14 MED ORDER — CYCLOBENZAPRINE HCL 10 MG PO TABS: 10.0000 mg | ORAL_TABLET | Freq: Three times a day (TID) | ORAL | Status: DC | PRN

## 2015-10-14 MED ORDER — TRAZODONE HCL 50 MG PO TABS: ORAL_TABLET | ORAL | Status: DC

## 2015-10-14 NOTE — Progress Notes (Signed)
Pre visit review using our clinic review tool, if applicable. No additional management support is needed unless otherwise documented below in the visit note. 

## 2015-10-14 NOTE — Progress Notes (Signed)
OFFICE VISIT  10/14/2015   CC:  Chief Complaint  Patient presents with  . Follow-up    fibromyalgia/Insomnia    HPI:    Patient is a 55 y.o. Caucasian female who presents for 3 mo f/u fibromyalgia and insomnia. Last visit I rx'd her flexeril b/c this historically has helped with her fibromyalgia sx's.  This is helping with muscle tightness and spasms in back.  She takes it 2-3 times per day most days.  No adverse side effects.  I also rx'd ambien 5-10 mg qhs prn for her insomnia.  Her psychiatrist took her off this med b/c it is controlled and she was worried pt would have to ween off of it and this would be troublesome.   Says she takes "simply sleep" and it doesn't really help.  She says he life is hectic/anxiety rules her life.  Still smoking, sites wt concerns as reason for not quitting.  Says wt gain would make her more depressed at this time.    Past Medical History  Diagnosis Date  . Osteoarthritis     Thumbs and low back (MRI L spine 09/2013 DDD with facet arthritis, no nerve impingement or spinal stenosis)  . OCD (obsessive compulsive disorder) 1990s    Dx'd by psychiatrist in [place]  . Hx of adenomatous colonic polyps     Pt says first at age 33.  Most recent colonoscopy 2013 showed no polyps.  Recall 5 yrs.  . Hypothyroidism Approx 2007  . Cervical cancer ([place]) 1990's    LEEP 1990s  . Fibromyalgia     Per old records, rheum w/u neg in past but and pt says she was given dx of fibromyalgia.  Neurontin 1200-1800 mg/day helped initially but she stopped it after it stopped helping (she self d/c'd it 10/2013)  . History of depression   . Insomnia   . Tobacco dependence   . Chronic constipation   . Left ear hearing loss Longstanding  . 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 Family history of colon cancer   . Ankle sprain bilat 04/03/18    x-rays neg    Past Surgical History  Procedure Laterality Date  . Cholecystectomy  1991  . Tonsillectomy and adenoidectomy  1981  .  Colonoscopy  first at age 39, most recent 07/2012    Has had 6-7 of these, all with polypectomies except most recent one done 07/2012.  Repeat after 07/2017.  . Dilation and curettage of uterus    . Leep  1990s  . Dexa  03/05/15    Normal--repeat 5 yrs    Outpatient Prescriptions Prior to Visit  Medication Sig Dispense Refill  . gabapentin (NEURONTIN) 300 MG capsule Take 300 mg by mouth. Taking 4 cap at bedtime as needed    . levothyroxine (SYNTHROID, LEVOTHROID) 75 MCG tablet Take 1 tablet (75 mcg total) by mouth daily. 90 tablet 3  . Multiple Vitamins-Minerals (CENTRUM ADULTS PO) Take by mouth daily.    Marland Kitchen PARoxetine (PAXIL) 10 MG tablet Take 10 mg by mouth daily.     . cyclobenzaprine (FLEXERIL) 10 MG tablet Take 1 tablet (10 mg total) by mouth 3 (three) times daily as needed for muscle spasms. 90 tablet 1  . zolpidem (AMBIEN) 5 MG tablet 1-2 tabs po qhs prn insomnia (Patient not taking: Reported on 10/14/2015) 60 tablet 0   No facility-administered medications prior to visit.    Allergies  Allergen Reactions  . Erythromycin Other (See Comments)    Thrush  .  Sudafed [Pseudoephedrine Hcl] Other (See Comments)    Heart Races    ROS As per HPI  PE: Blood pressure 129/85, pulse 77, temperature 97.9 F (36.6 C), temperature source Oral, resp. rate 16, height 5\' 6"  (1.676 m), weight 168 lb 4 oz (76.318 kg), SpO2 100 %. Gen: Alert, well appearing.  Patient is oriented to person, place, time, and situation. AFFECT: pleasant, lucid thought and speech.   LABS:  Lab Results  Component Value Date   TSH 2.08 01/22/2015    IMPRESSION AND PLAN:  1) Fibromyalgia; pretty persistently symptomatic.  May continue flexeril bid to tid prn--rx renewed.  2) Insomnia: discussed sleep hygiene some more today. Will do trial of trazodone 50mg , 1-2 tabs po qhs prn.  Therapeutic expectations and side effect profile of medication discussed today.  Patient's questions answered.  An After Visit  Summary was printed and given to the patient.  FOLLOW UP: Return in about 4 months (around 02/11/2016) for annual CPE (fasting).  Signed:  Crissie Sickles, MD           10/14/2015

## 2015-11-03 ENCOUNTER — Encounter: Payer: Self-pay | Admitting: Family Medicine

## 2015-11-05 ENCOUNTER — Other Ambulatory Visit: Payer: Self-pay | Admitting: *Deleted

## 2015-11-05 MED ORDER — PAROXETINE HCL 10 MG PO TABS: 10.0000 mg | ORAL_TABLET | Freq: Every day | ORAL | Status: DC

## 2015-11-05 NOTE — Telephone Encounter (Signed)
RF request for Paroxetine LOV: 07/15/15 Next ov: 02/11/16 Last written: 04/30/14 #90 w/ 0RF

## 2015-11-18 ENCOUNTER — Encounter: Payer: Self-pay | Admitting: Family Medicine

## 2016-01-19 ENCOUNTER — Other Ambulatory Visit: Payer: Self-pay | Admitting: Family Medicine

## 2016-01-19 DIAGNOSIS — Z1231 Encounter for screening mammogram for malignant neoplasm of breast: Secondary | ICD-10-CM

## 2016-01-21 ENCOUNTER — Telehealth: Payer: Self-pay | Admitting: Family Medicine

## 2016-01-21 NOTE — Telephone Encounter (Signed)
Please advise 

## 2016-01-21 NOTE — Telephone Encounter (Signed)
Patient has been sick & has been using Mucinex DM & her inhaler. The last time she had blood work after taking these drugs it effected the results. Her appointment for the bloodwork is 6/16. She stopped taking the meds 3 days ago. Is that long enough to not affect the results? I advised patient that physician is out of the office this week. Patient okay with a CB next week.

## 2016-01-21 NOTE — Telephone Encounter (Signed)
She is fine to get blood work 01/30/16.

## 2016-01-22 NOTE — Telephone Encounter (Signed)
Left detailed message on pt's cell.  Okay per DPR. 

## 2016-01-30 ENCOUNTER — Other Ambulatory Visit (INDEPENDENT_AMBULATORY_CARE_PROVIDER_SITE_OTHER): Payer: Managed Care, Other (non HMO)

## 2016-01-30 ENCOUNTER — Ambulatory Visit
Admission: RE | Admit: 2016-01-30 | Discharge: 2016-01-30 | Disposition: A | Discharge status: Home / Self Care | Payer: Managed Care, Other (non HMO) | Source: Ambulatory Visit | Attending: Family Medicine | Admitting: Family Medicine

## 2016-01-30 DIAGNOSIS — Z0000 Encounter for general adult medical examination without abnormal findings: Secondary | ICD-10-CM | POA: Diagnosis not present

## 2016-01-30 DIAGNOSIS — Z1231 Encounter for screening mammogram for malignant neoplasm of breast: Secondary | ICD-10-CM

## 2016-01-30 LAB — COMPREHENSIVE METABOLIC PANEL
ALT: 12 U/L (ref 0–35)
AST: 20 U/L (ref 0–37)
Albumin: 4.2 g/dL (ref 3.5–5.2)
Alkaline Phosphatase: 72 U/L (ref 39–117)
BUN: 14 mg/dL (ref 6–23)
CHLORIDE: 106 meq/L (ref 96–112)
CO2: 29 mEq/L (ref 19–32)
Calcium: 9.5 mg/dL (ref 8.4–10.5)
Creatinine, Ser: 0.94 mg/dL (ref 0.40–1.20)
GFR: 65.35 mL/min (ref >60.00)
GLUCOSE: 89 mg/dL (ref 70–99)
POTASSIUM: 5.1 meq/L (ref 3.5–5.1)
SODIUM: 142 meq/L (ref 135–145)
Total Bilirubin: 0.6 mg/dL (ref 0.2–1.2)
Total Protein: 6.9 g/dL (ref 6.0–8.3)

## 2016-01-30 LAB — CBC WITH DIFFERENTIAL/PLATELET
BASOS PCT: 0.6 % (ref 0.0–3.0)
Basophils Absolute: 0.1 10*3/uL (ref 0.0–0.1)
EOS PCT: 2.7 % (ref 0.0–5.0)
Eosinophils Absolute: 0.3 10*3/uL (ref 0.0–0.7)
HEMATOCRIT: 44.2 % (ref 36.0–46.0)
HEMOGLOBIN: 14.3 g/dL (ref 12.0–15.0)
LYMPHS PCT: 30.3 % (ref 12.0–46.0)
Lymphs Abs: 3.4 10*3/uL (ref 0.7–4.0)
MCHC: 32.4 g/dL (ref 30.0–36.0)
MCV: 95.2 fl (ref 78.0–100.0)
MONO ABS: 0.8 10*3/uL (ref 0.1–1.0)
MONOS PCT: 7.1 % (ref 3.0–12.0)
Neutro Abs: 6.7 10*3/uL (ref 1.4–7.7)
Neutrophils Relative %: 59.3 % (ref 43.0–77.0)
Platelets: 408 10*3/uL — ABNORMAL HIGH (ref 150.0–400.0)
RBC: 4.64 Mil/uL (ref 3.87–5.11)
RDW: 15.3 % (ref 11.5–15.5)
WBC: 11.4 10*3/uL — AB (ref 4.0–10.5)

## 2016-01-30 LAB — LIPID PANEL
CHOL/HDL RATIO: 4
Cholesterol: 217 mg/dL — ABNORMAL HIGH (ref 0–200)
HDL: 56.8 mg/dL (ref >39.00)
LDL CALC: 131 mg/dL — AB (ref 0–99)
NONHDL: 160.37
Triglycerides: 147 mg/dL (ref 0.0–149.0)
VLDL: 29.4 mg/dL (ref 0.0–40.0)

## 2016-01-30 LAB — TSH: TSH: 1.62 u[IU]/mL (ref 0.35–4.50)

## 2016-02-11 ENCOUNTER — Encounter: Payer: Self-pay | Admitting: Family Medicine

## 2016-02-11 ENCOUNTER — Ambulatory Visit (INDEPENDENT_AMBULATORY_CARE_PROVIDER_SITE_OTHER): Payer: Managed Care, Other (non HMO) | Admitting: Family Medicine

## 2016-02-11 ENCOUNTER — Encounter: Payer: Managed Care, Other (non HMO) | Admitting: Family Medicine

## 2016-02-11 VITALS — BP 122/86 | HR 71 | Temp 97.8°F | Resp 16 | Ht 65.25 in | Wt 169.5 lb

## 2016-02-11 DIAGNOSIS — M654 Radial styloid tenosynovitis [de Quervain]: Secondary | ICD-10-CM

## 2016-02-11 DIAGNOSIS — Z0000 Encounter for general adult medical examination without abnormal findings: Secondary | ICD-10-CM | POA: Diagnosis not present

## 2016-02-11 DIAGNOSIS — Z23 Encounter for immunization: Secondary | ICD-10-CM

## 2016-02-11 MED ORDER — CYCLOBENZAPRINE HCL 10 MG PO TABS: 10.0000 mg | ORAL_TABLET | Freq: Three times a day (TID) | ORAL | Status: DC | PRN

## 2016-02-11 MED ORDER — TRAZODONE HCL 50 MG PO TABS: ORAL_TABLET | ORAL | Status: DC

## 2016-02-11 MED ORDER — GABAPENTIN 300 MG PO CAPS: ORAL_CAPSULE | ORAL | Status: DC

## 2016-02-11 NOTE — Progress Notes (Signed)
Office Note 02/11/2016  CC:  Chief Complaint  Patient presents with  . Annual Exam    Pt is not fasting. Labs done 01/30/16.     HPI:  Erica King is a 55 y.o. White female who is here for annual health maintenance exam. Reviewed recent fasting HP labs with pt in detail today: all stable.  Mild chronic elevation of WBCs and platelets.  Borderline hyperlipidemia. Eye exam UTD. Dental preventative UTD.  Describes 2 yrs of gradually worsening bilat thumb pain.  No hx of injury in R, but left thumb started hurting after a vigorous high 5 from her husband.  Sometimes the thumbs swell.  Never turn red and not hot.  Hurts so much with grabbing things that she sometimes drops things.  Past Medical History  Diagnosis Date  . Osteoarthritis     Thumbs and low back (MRI L spine 09/2013 DDD with facet arthritis, no nerve impingement or spinal stenosis)  . OCD (obsessive compulsive disorder) 1990s    Dx'd by psychiatrist in [place]  . Hx of adenomatous colonic polyps     Pt says first at age 13.  Most recent colonoscopy 2013 showed no polyps.  Recall 5 yrs.  . Hypothyroidism Approx 2007  . Cervical cancer ([place]) 1990's    LEEP 1990s  . Fibromyalgia     Per old records, rheum w/u neg in past but and pt says she was given dx of fibromyalgia.  Neurontin 1200-1800 mg/day helped initially but she stopped it after it stopped helping (she self d/c'd it 10/2013)  . History of depression   . Insomnia   . Tobacco dependence     still 10 cigs/day as of 01/2016  . Chronic constipation   . Left ear hearing loss Longstanding  . 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 Family history of colon cancer   . Ankle sprain bilat 04/03/18    x-rays neg    Past Surgical History  Procedure Laterality Date  . Cholecystectomy  1991  . Tonsillectomy and adenoidectomy  1981  . Colonoscopy  first at age 31, most recent 07/2012    Has had 6-7 of these, all with polypectomies except most recent one done 07/2012.  Repeat after  07/2017.  . Dilation and curettage of uterus    . Leep  1990s  . Dexa  03/05/15    Normal--repeat 5 yrs    Family History  Problem Relation Age of Onset  . CAD Mother     and father  . Multiple sclerosis Father     d. age 68  . Diabetes Mellitus II Father   . Anxiety disorder Mother   . COPD Mother     d of ruptured SB     Social History   Social History  . Marital Status: Married    Spouse Name: N/A  . Number of Children: N/A  . Years of Education: N/A   Occupational History  . Not on file.   Social History Main Topics  . Smoking status: Current Every Day Smoker  . Smokeless tobacco: Never Used  . Alcohol Use: 0.0 oz/week    0 Standard drinks or equivalent per week  . Drug Use: Yes    Special: Marijuana  . Sexual Activity: Not on file   Other Topics Concern  . Not on file   Social History Narrative   Married, 2 adult children, has grandchildren.  She has 7 sibs.   Orig from [place], relocated to [facility] 08/2012.  Educ: HS and some college.   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   Tobacco 10 pack-yr hx.  Smokes marijuana occasionally, denies use of any other illegal drugs.  Alcohol: none.             Outpatient Prescriptions Prior to Visit  Medication Sig Dispense Refill  . levothyroxine (SYNTHROID, LEVOTHROID) 75 MCG tablet Take 1 tablet (75 mcg total) by mouth daily. 90 tablet 3  . Multiple Vitamins-Minerals (CENTRUM ADULTS PO) Take by mouth daily.    Marland Kitchen PARoxetine (PAXIL) 10 MG tablet Take 1 tablet (10 mg total) by mouth daily. 90 tablet 3  . cyclobenzaprine (FLEXERIL) 10 MG tablet Take 1 tablet (10 mg total) by mouth 3 (three) times daily as needed for muscle spasms. 90 tablet 1  . gabapentin (NEURONTIN) 300 MG capsule Take 300 mg by mouth. Taking 4 cap at bedtime as needed    . traZODone (DESYREL) 50 MG tablet 1-2 tabs po qhs prn insomnia 60 tablet 4   No facility-administered medications prior to visit.     Allergies  Allergen Reactions  . Erythromycin Other (See Comments)    Thrush  . Sudafed [Pseudoephedrine Hcl] Other (See Comments)    Heart Races    ROS Review of Systems  Constitutional: Negative for fever, chills, appetite change and fatigue.  HENT: Negative for congestion, dental problem, ear pain and sore throat.        "white line in mouth/inside cheek comes out worse when I get stressed"  Eyes: Negative for discharge, redness and visual disturbance.  Respiratory: Negative for cough, chest tightness, shortness of breath and wheezing.   Cardiovascular: Negative for chest pain, palpitations and leg swelling.  Gastrointestinal: Negative for nausea, vomiting, abdominal pain, diarrhea and blood in stool.  Genitourinary: Negative for dysuria, urgency, frequency, hematuria, flank pain and difficulty urinating.  Musculoskeletal: Negative for myalgias, back pain, joint swelling, arthralgias and neck stiffness.  Skin: Negative for pallor and rash.  Neurological: Negative for dizziness, speech difficulty, weakness and headaches.  Hematological: Negative for adenopathy. Does not bruise/bleed easily.  Psychiatric/Behavioral: Negative for confusion and sleep disturbance. The patient is not nervous/anxious.     PE; Blood pressure 122/86, pulse 71, temperature 97.8 F (36.6 C), temperature source Oral, resp. rate 16, height 5' 5.25" (1.657 m), weight 169 lb 8 oz (76.885 kg), SpO2 99 %. Gen: Alert, well appearing.  Patient is oriented to person, place, time, and situation. AFFECT: pleasant, lucid thought and speech. ENT: Ears: EACs clear, normal epithelium.  TMs with good light reflex and landmarks bilaterally.  Eyes: no injection, icteris, swelling, or exudate.  EOMI, PERRLA. Nose: no drainage or turbinate edema/swelling.  No injection or focal lesion.  Mouth: lips without lesion/swelling.  Oral mucosa pink and moist.  Dentition intact and without obvious caries or gingival swelling.   Oropharynx without erythema, exudate, or swelling.   Buccal mucosa inside each cheek has a faint, slightly raised whitish linear lesion.  No ulcerations, swelling, or erythema.   Neck: supple/nontender.  No LAD, mass, or TM.  Carotid pulses 2+ bilaterally, without bruits. CV: RRR, no m/r/g.   LUNGS: CTA bilat, nonlabored resps, good aeration in all lung fields. ABD: soft, NT, ND, BS normal.  No hepatospenomegaly or mass.  No bruits. EXT: no clubbing, cyanosis, or edema.   Hands: she has TTP over dorsal aspect of 1st carpo-metacarpal joint and proximal thumb diffusely on each hand, with some mild soft tissue swelling over this area  on left hand.  No erythema or warmth.  She has normal ROM of thumb but Finkelstein's + bilat. Musculoskeletal: no joint swelling, erythema, warmth, or tenderness.  ROM of all joints intact. Skin - no sores or suspicious lesions or rashes or color changes  Pertinent labs:  Lab Results  Component Value Date   TSH 1.62 01/30/2016   Lab Results  Component Value Date   WBC 11.4* 01/30/2016   HGB 14.3 01/30/2016   HCT 44.2 01/30/2016   MCV 95.2 01/30/2016   PLT 408.0* 01/30/2016   Lab Results  Component Value Date   CREATININE 0.94 01/30/2016   BUN 14 01/30/2016   NA 142 01/30/2016   K 5.1 01/30/2016   CL 106 01/30/2016   CO2 29 01/30/2016   Lab Results  Component Value Date   ALT 12 01/30/2016   AST 20 01/30/2016   ALKPHOS 72 01/30/2016   BILITOT 0.6 01/30/2016   Lab Results  Component Value Date   CHOL 217* 01/30/2016   Lab Results  Component Value Date   HDL 56.80 01/30/2016   Lab Results  Component Value Date   LDLCALC 131* 01/30/2016   Lab Results  Component Value Date   TRIG 147.0 01/30/2016   Lab Results  Component Value Date   CHOLHDL 4 01/30/2016    ASSESSMENT AND PLAN:   1) Bilateral De Quervain's tenosynovitis: start thumb spica splint bilat (fitted and dispensed to pt today) and if not significantly improved with this in  55mo then next step is steroid injection.    2) Health maintenance exam: Reviewed age and gender appropriate health maintenance issues (prudent diet, regular exercise, health risks of tobacco and excessive alcohol, use of seatbelts, fire alarms in home, use of sunscreen).  Also reviewed age and gender appropriate health screening as well as vaccine recommendations. Tdap given today. HP labs reviewed/stable.  She will check with her insurer about coverage for Hep C and HIV virus screening. Next colonoscopy due after 07/2017. Mammogram UTD. Cervical ca screening as per her GYN MD at [facility] in [place] (has appt next month). Encouraged smoking cessation and increased exercise.  An After Visit Summary was printed and given to the patient.  FOLLOW UP:  Return in about 6 months (around 08/12/2016) for routine chronic illness f/u (30 min).  Signed:  Crissie Sickles, MD           02/11/2016

## 2016-02-11 NOTE — Progress Notes (Signed)
Pre visit review using our clinic review tool, if applicable. No additional management support is needed unless otherwise documented below in the visit note. 

## 2016-06-16 ENCOUNTER — Other Ambulatory Visit: Payer: Self-pay | Admitting: Family Medicine

## 2016-07-22 ENCOUNTER — Encounter: Payer: Self-pay | Admitting: Family Medicine

## 2016-07-22 ENCOUNTER — Ambulatory Visit (INDEPENDENT_AMBULATORY_CARE_PROVIDER_SITE_OTHER): Payer: Managed Care, Other (non HMO) | Admitting: Family Medicine

## 2016-07-22 VITALS — BP 124/78 | HR 91 | Temp 98.1°F | Resp 16 | Ht 65.25 in | Wt 164.5 lb

## 2016-07-22 DIAGNOSIS — F418 Other specified anxiety disorders: Secondary | ICD-10-CM | POA: Diagnosis not present

## 2016-07-22 DIAGNOSIS — M797 Fibromyalgia: Secondary | ICD-10-CM | POA: Diagnosis not present

## 2016-07-22 DIAGNOSIS — F5101 Primary insomnia: Secondary | ICD-10-CM | POA: Diagnosis not present

## 2016-07-22 DIAGNOSIS — E039 Hypothyroidism, unspecified: Secondary | ICD-10-CM | POA: Diagnosis not present

## 2016-07-22 DIAGNOSIS — F329 Major depressive disorder, single episode, unspecified: Secondary | ICD-10-CM

## 2016-07-22 DIAGNOSIS — F32A Depression, unspecified: Secondary | ICD-10-CM

## 2016-07-22 DIAGNOSIS — F419 Anxiety disorder, unspecified: Secondary | ICD-10-CM

## 2016-07-22 MED ORDER — GABAPENTIN 300 MG PO CAPS: ORAL_CAPSULE | ORAL | 3 refills | Status: DC

## 2016-07-22 MED ORDER — DIAZEPAM 10 MG PO TABS: 10.0000 mg | ORAL_TABLET | Freq: Two times a day (BID) | ORAL | 5 refills | Status: DC | PRN

## 2016-07-22 MED ORDER — TRAZODONE HCL 50 MG PO TABS: ORAL_TABLET | ORAL | 3 refills | Status: DC

## 2016-07-22 NOTE — Progress Notes (Signed)
OFFICE VISIT  07/22/2016   CC:  Chief Complaint  Patient presents with  . Follow-up    Pt is not fasting.    HPI:    Patient is a 55 y.o. Caucasian female who presents for 6 mo f/u hypothyroidism, fibromyalgia, anxiety and depression, and insomnia. Last visit we did trial of trazodone 50-100mg  qhs prn (her psychiatrist prefers her NOT to use a controlled substance for sleep).  Trazodone helps a moderate amount with 100mg  dosing nightly, no side effects.  Pt states she has run out of her diazepam and pt states she no longer sees her psychiatrist.  She usually takes this med for severe anxiety.  Says current dosing of paxil 10mg  is adequate for her.  Thyroid : takes med as directed on the majority of days.  Fibro: flexeril helps with her myalgias and muscle spasms.   She insists the gabapentin she is on for fibromyalgia is not helping.  She takes her entire 1200mg  dose at bedtime and has never taken this med in daytime.    Past Medical History:  Diagnosis Date  . Ankle sprain bilat 04/03/18   x-rays neg  . Cervical cancer ([place]) 1990's   LEEP 1990s  . Chronic constipation   . Family history of colon cancer   . Fibromyalgia    Per old records, rheum w/u neg in past but and pt says she was given dx of fibromyalgia.  Neurontin 1200-1800 mg/day helped initially but she stopped it after it stopped helping (she self d/c'd it 10/2013)  . History of depression   . Hx of adenomatous colonic polyps    Pt says first at age 24.  Most recent colonoscopy 2013 showed no polyps.  Recall 5 yrs.  . Hypothyroidism Approx 2007  . Insomnia   . Left ear hearing loss Longstanding  . 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 Osteoarthritis    Thumbs and low back (MRI L spine 09/2013 DDD with facet arthritis, no nerve impingement or spinal stenosis)  . 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 Tobacco dependence    still 10 cigs/day as of 01/2016    Past Surgical History:   Procedure Laterality Date  . CHOLECYSTECTOMY  1991  . COLONOSCOPY  first at age 44, most recent 07/2012   Has had 6-7 of these, all with polypectomies except most recent one done 07/2012.  Repeat after 07/2017.  Marland Kitchen DEXA  03/05/15   Normal--repeat 5 yrs  . DILATION AND CURETTAGE OF UTERUS    . LEEP  1990s  . TONSILLECTOMY AND ADENOIDECTOMY  1981    Outpatient Medications Prior to Visit  Medication Sig Dispense Refill  . cyclobenzaprine (FLEXERIL) 10 MG tablet Take 1 tablet (10 mg total) by mouth 3 (three) times daily as needed for muscle spasms. 270 tablet 3  . levothyroxine (SYNTHROID, LEVOTHROID) 75 MCG tablet Take 1 tablet (75 mcg total) by mouth daily. 90 tablet 3  . Multiple Vitamins-Minerals (CENTRUM ADULTS PO) Take by mouth daily.    Marland Kitchen PARoxetine (PAXIL) 10 MG tablet Take 1 tablet (10 mg total) by mouth daily. 90 tablet 3  . gabapentin (NEURONTIN) 300 MG capsule Take 4 caps po at bedtime as needed 360 capsule 3  . traZODone (DESYREL) 50 MG tablet 1-2 tabs po qhs prn insomnia 180 tablet 3  . levothyroxine (SYNTHROID, LEVOTHROID) 75 MCG tablet TAKE 1 TABLET BY MOUTH DAILY BEFORE BREAKFAST (Patient not taking: Reported on 07/22/2016) 90 tablet 1  No facility-administered medications prior to visit.     Allergies  Allergen Reactions  . Erythromycin Other (See Comments)    Thrush  . Sudafed [Pseudoephedrine Hcl] Other (See Comments)    Heart Races    ROS As per HPI  PE: Blood pressure 124/78, pulse 91, temperature 98.1 F (36.7 C), temperature source Oral, resp. rate 16, height 5' 5.25" (1.657 m), weight 164 lb 8 oz (74.6 kg), SpO2 99 %. Gen: Alert, well appearing.  Patient is oriented to person, place, time, and situation. AFFECT: pleasant, lucid thought and speech. CV: RRR, no m/r/g.   LUNGS: CTA bilat, nonlabored resps, good aeration in all lung fields. EXT: no clubbing, cyanosis, or edema.   LABS:  Lab Results  Component Value Date   TSH 1.62 01/30/2016     IMPRESSION AND PLAN:  1) Fibromyalgia: not ideally controlled.  Needs to try taking her gabapentin spread throughout the daytime instead of just a bedtime dose.  She'll try this for a month and if not improved she'll call and we'll go ahead and rx a trial of lyrica.  Continue cyclobenzaprine. She is not interested in cymbalta trial b/c she would have to get off her paxil if we did this.  2) Insomnia: trazodone giving a mild/moderate amount of help, without side effect.  We'll push the dose to 3-4 of the 50mg  tabs qhs.  3) Anxiety and depression: mood stable for the most part, anxiety is a big part of her life as usual. I did RF her valium 10mg  bid, #60, RF x 5 today.  She no longer sees a psychiatrist---says she just got tired of paying an extra copay.  4) Hypothyroidism: last TSH check 6 mo ago was good.  She is compliant with med. Plan recheck TSH 6 mo.  An After Visit Summary was printed and given to the patient.  FOLLOW UP: Return in about 6 months (around 01/20/2017) for annual CPE (fasting).  Signed:  Crissie Sickles, MD           07/22/2016

## 2016-07-22 NOTE — Progress Notes (Signed)
Pre visit review using our clinic review tool, if applicable. No additional management support is needed unless otherwise documented below in the visit note. 

## 2016-09-21 IMAGING — DX DG FOOT COMPLETE 3+V*L*
3 series · 3 of 3 positions shown · non-contrast
Comparison: None.

CLINICAL DATA: Fall down steps yesterday, bilateral metatarsal pain
and swelling

EXAM:
LEFT FOOT - COMPLETE 3+ VIEW

[foot ap]
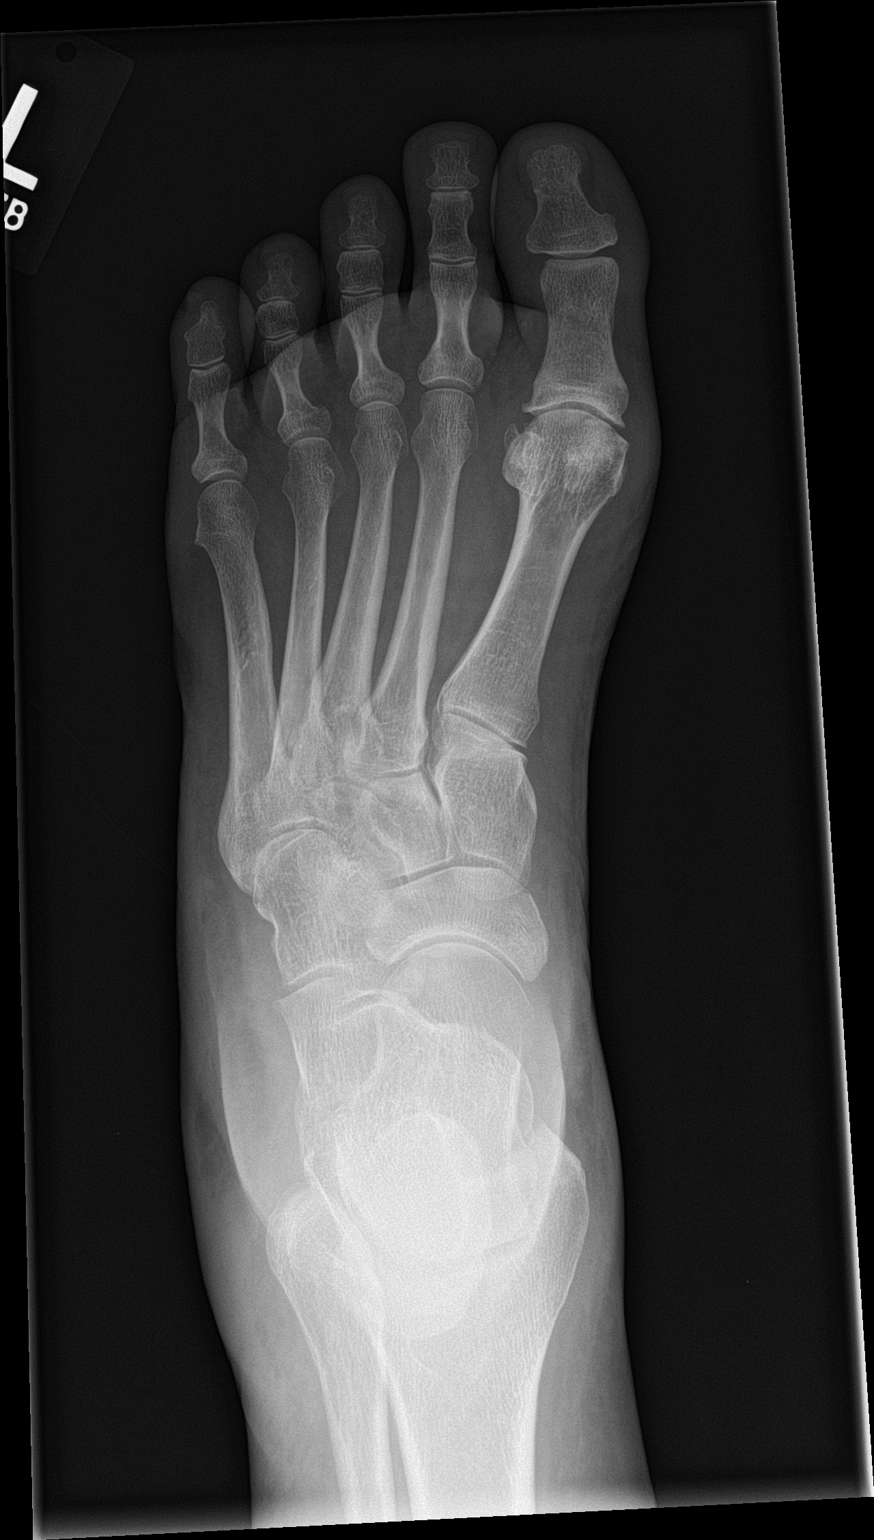

[foot obl]
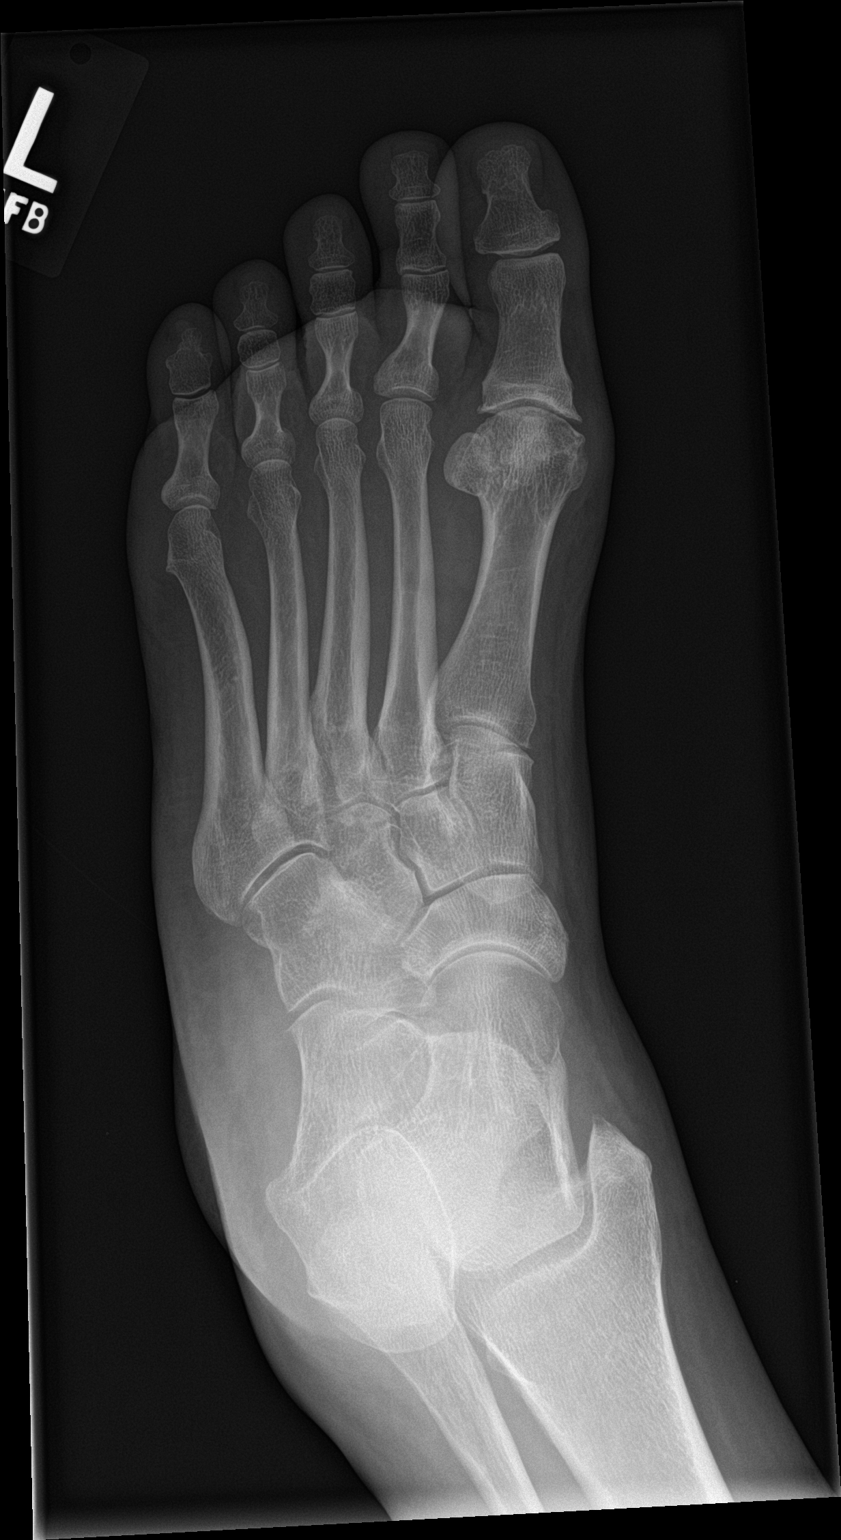

[foot lat]
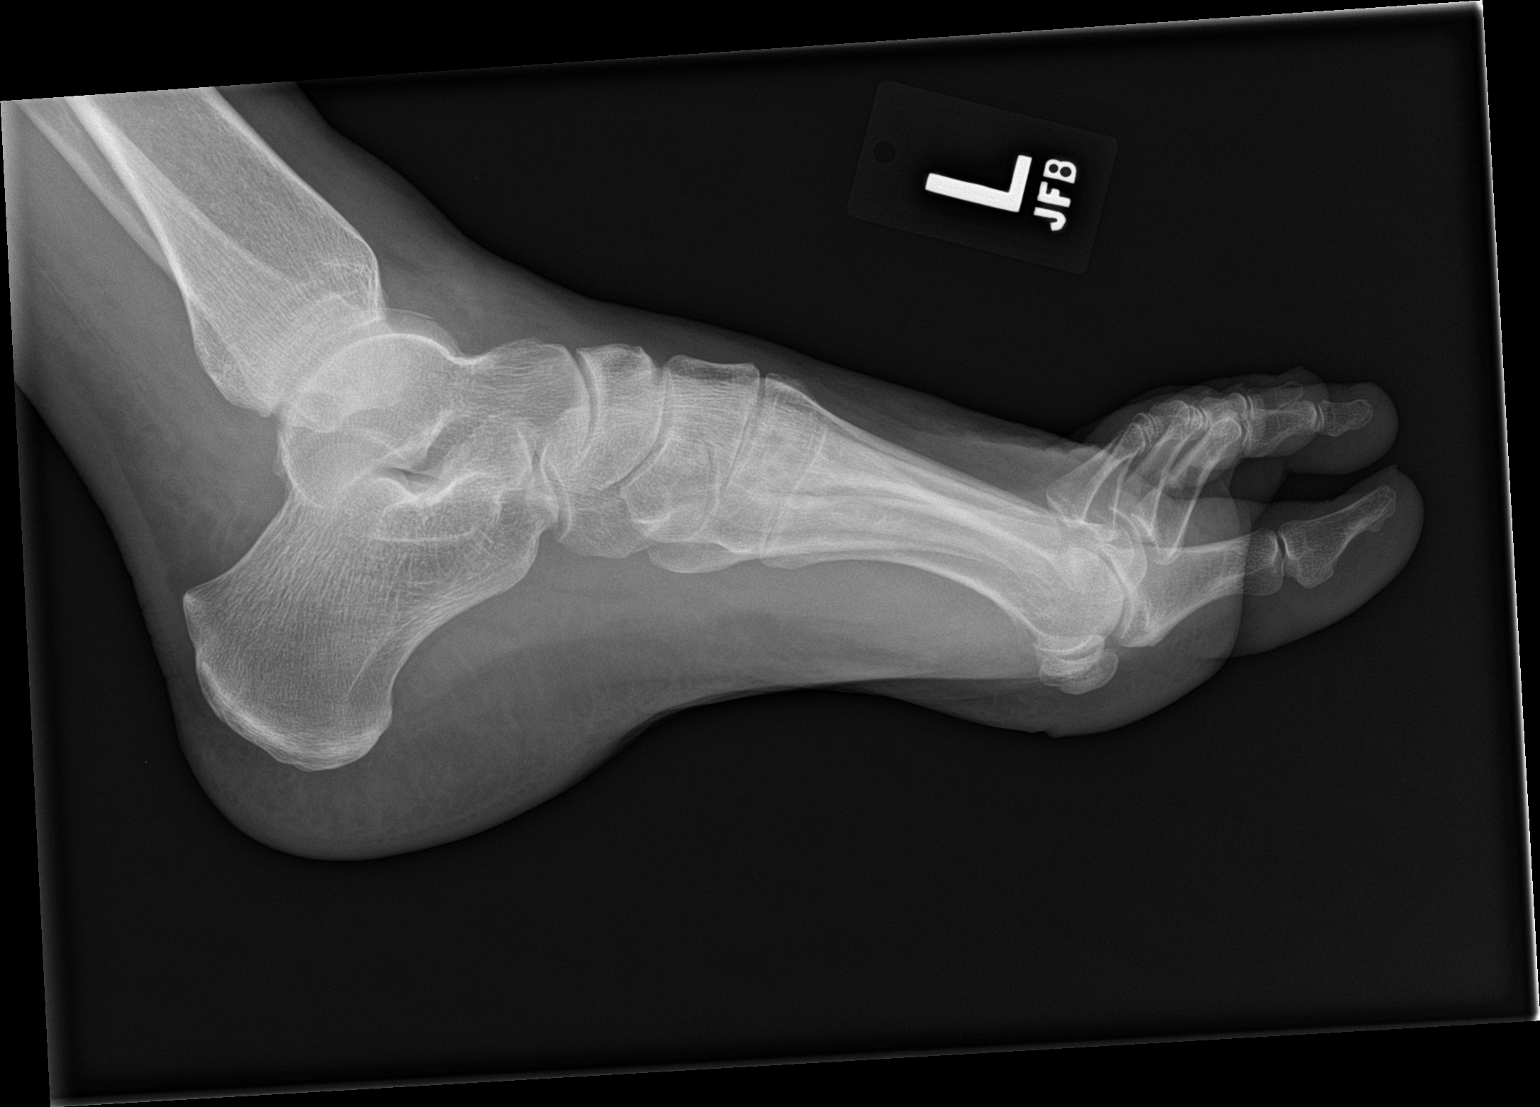

[3 of 3 positions shown; findings below may reference images not displayed]

FINDINGS: Three views of the left foot submitted. No acute fracture or
subluxation. No radiopaque foreign body.
IMPRESSION: Negative.

## 2016-09-21 IMAGING — DX DG ANKLE COMPLETE 3+V*R*
3 series · 3 of 3 positions shown · non-contrast
Comparison: None.

CLINICAL DATA: Fell down steps yesterday

EXAM:
RIGHT ANKLE - COMPLETE 3+ VIEW

[ankle ap]
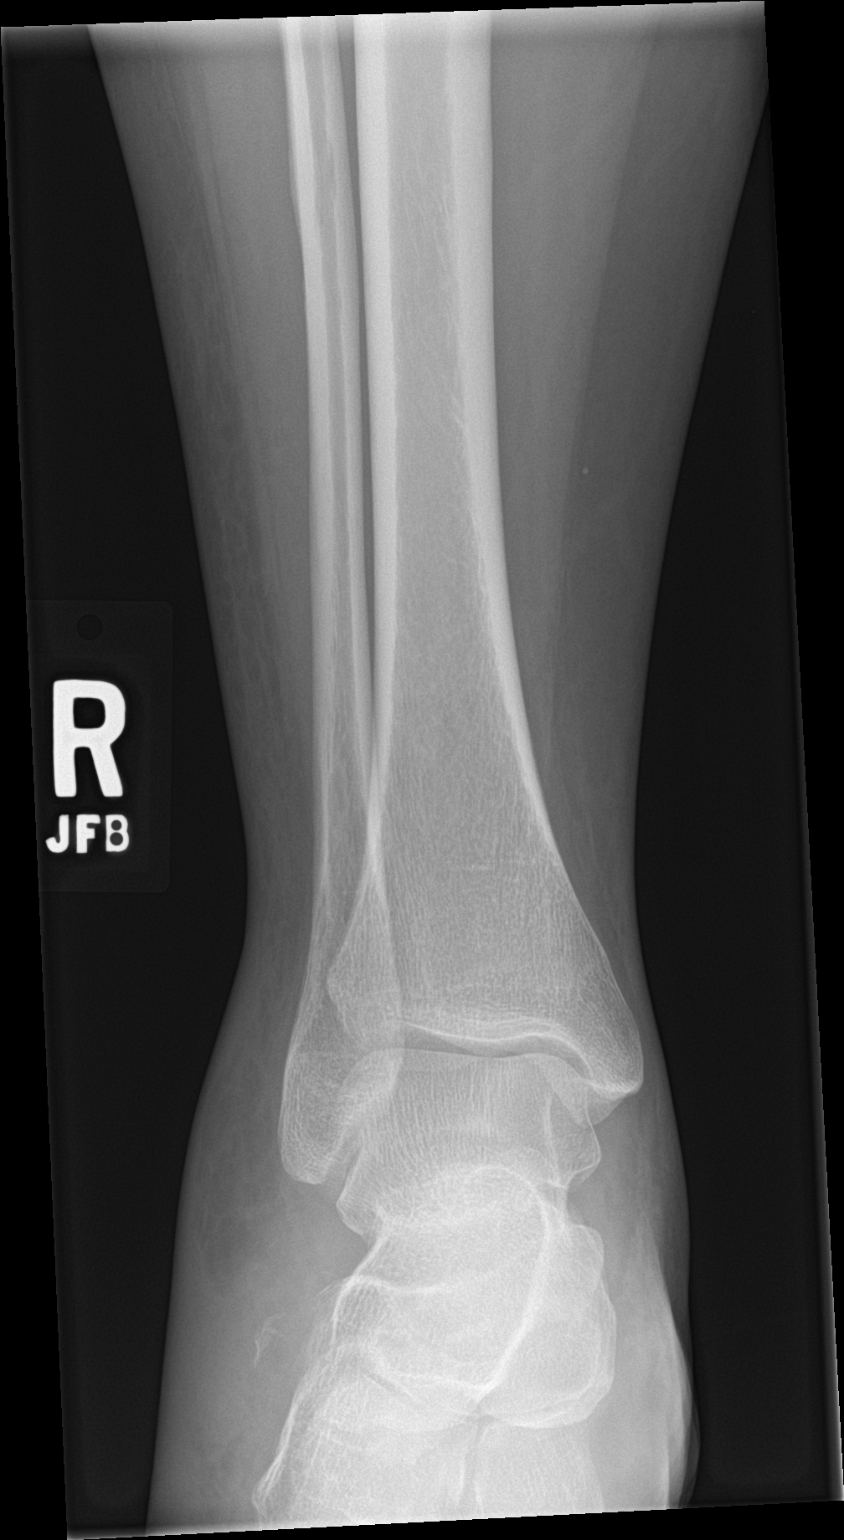

[ankle obl]
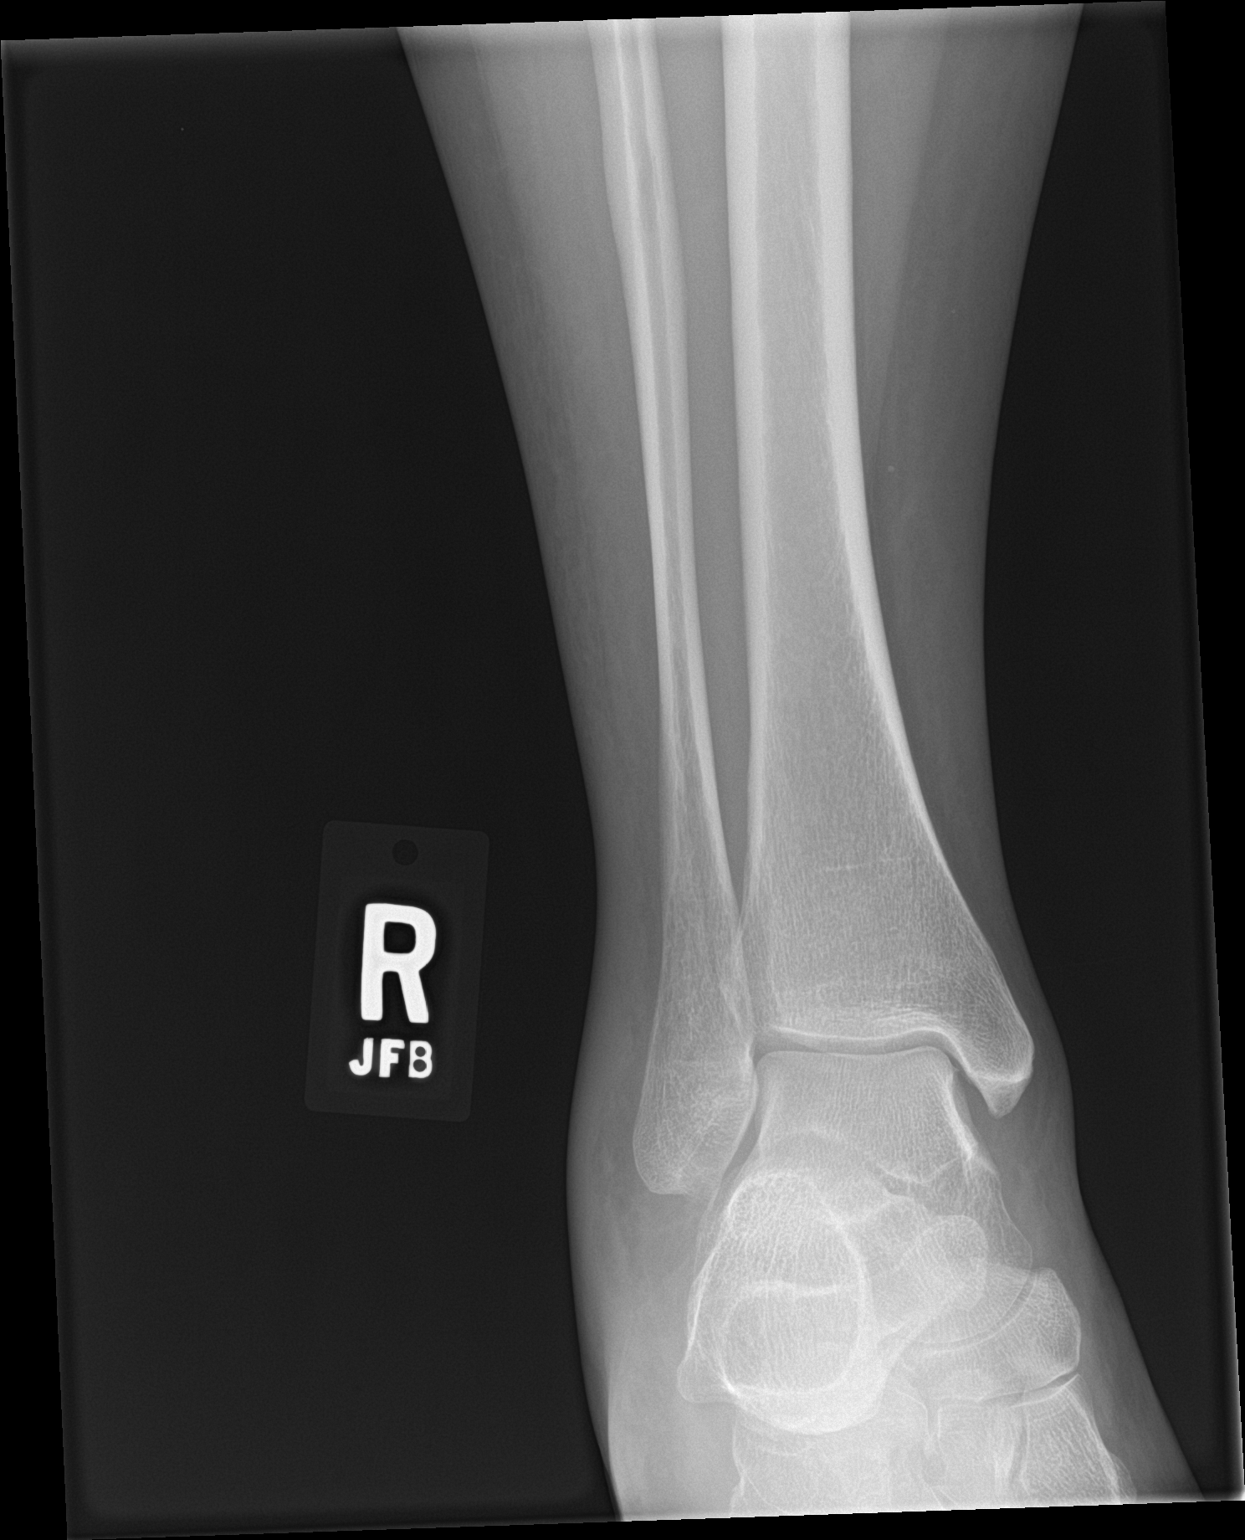

[ankle lat]
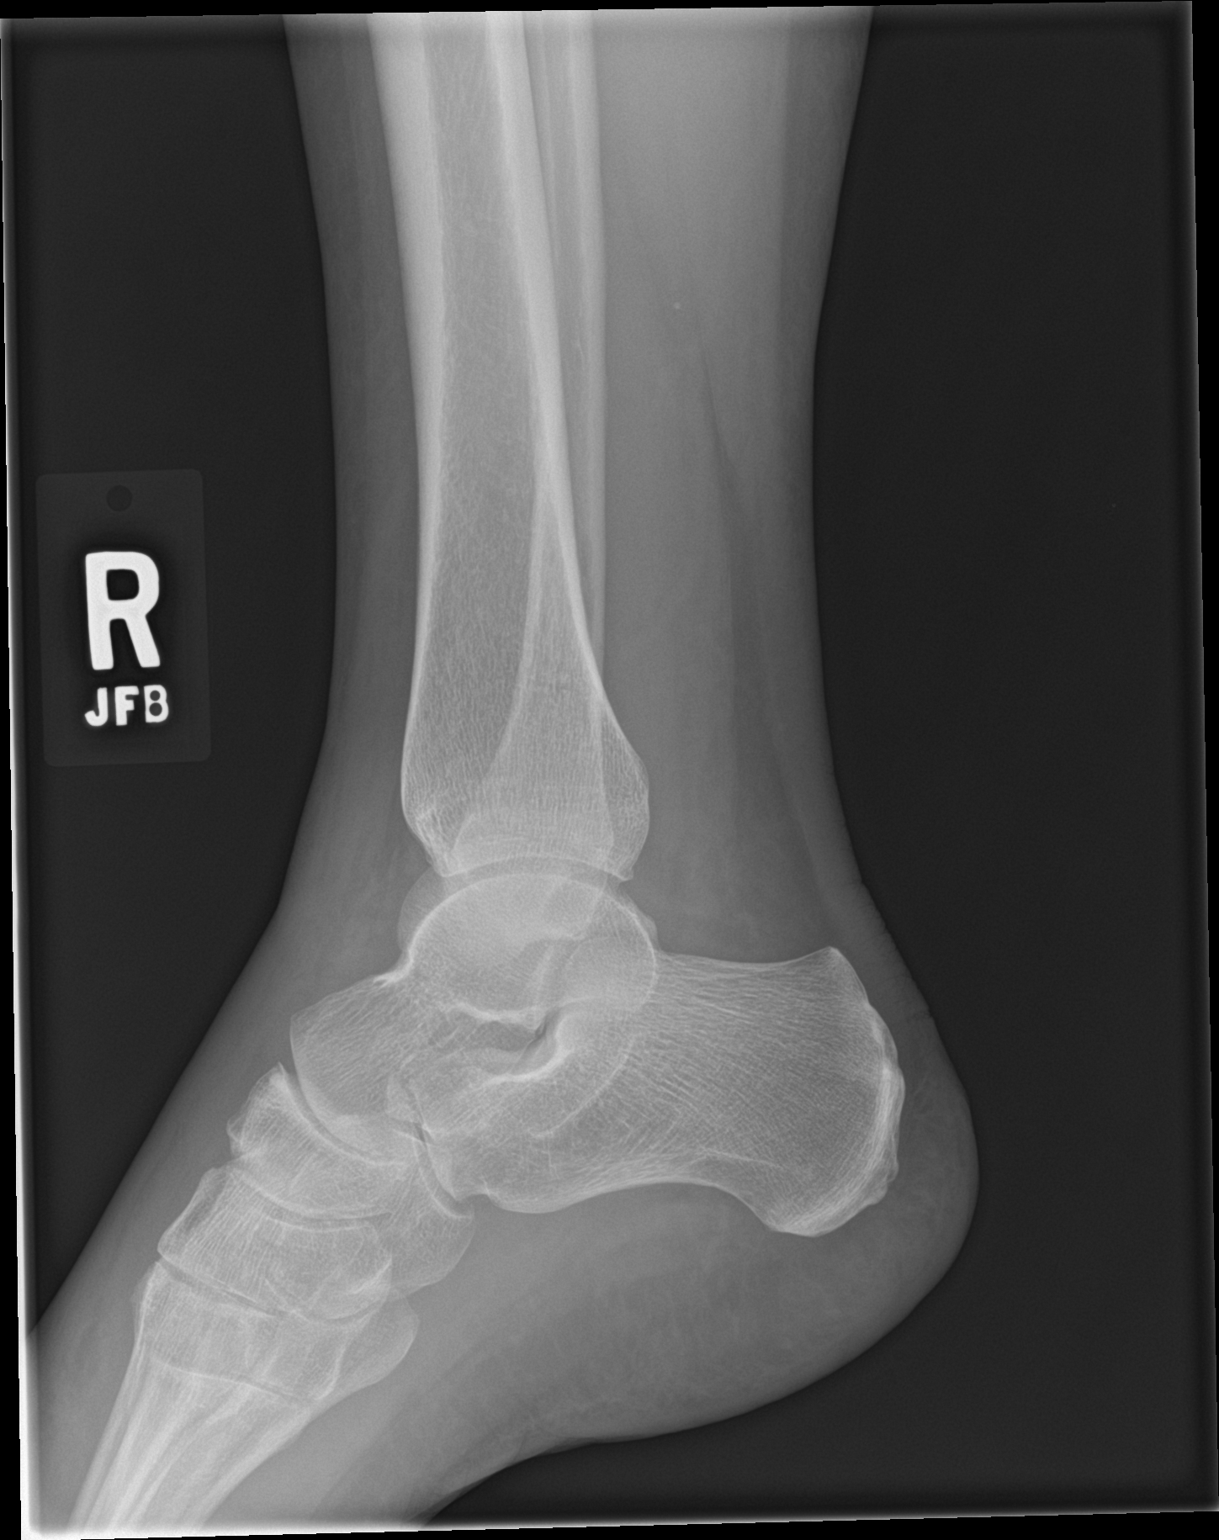

[3 of 3 positions shown; findings below may reference images not displayed]

FINDINGS: Three views of the right ankle submitted. No acute fracture or
subluxation. No radiopaque foreign body. Ankle mortise is preserved.
Mild lateral soft tissue swelling.
IMPRESSION: No acute fracture or subluxation. Mild lateral soft tissue swelling.

## 2016-09-21 IMAGING — DX DG ANKLE COMPLETE 3+V*L*
3 series · 3 of 3 positions shown · non-contrast
Comparison: None.

CLINICAL DATA: Fell down steps yesterday

EXAM:
LEFT ANKLE COMPLETE - 3+ VIEW

[ankle ap]
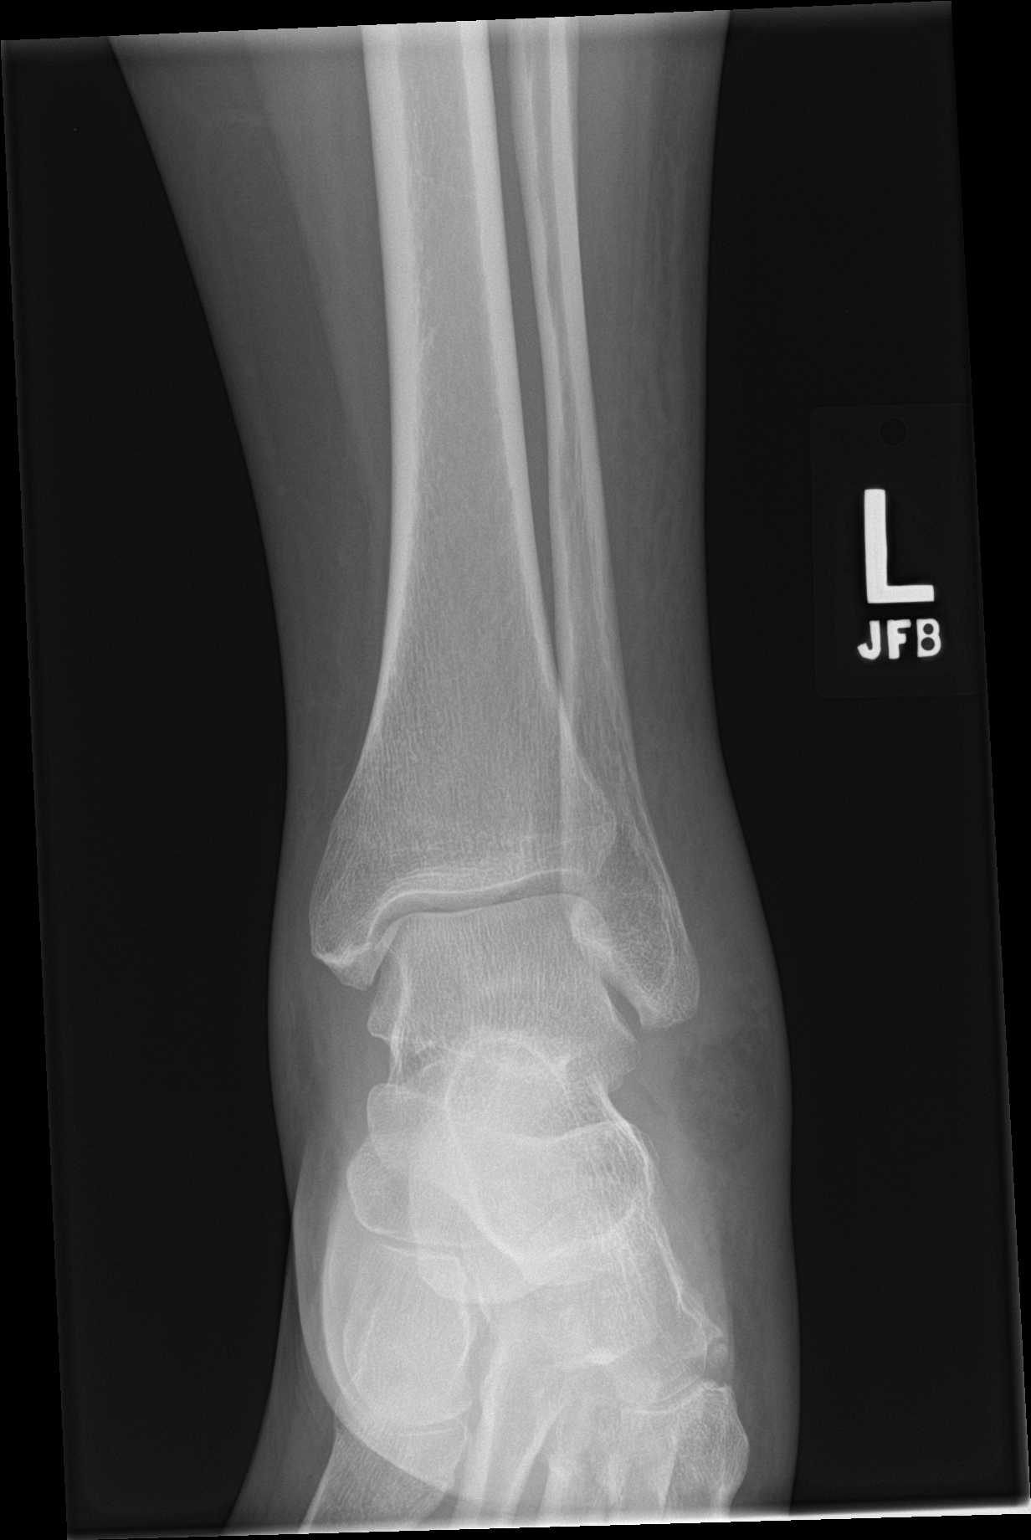

[ankle obl]
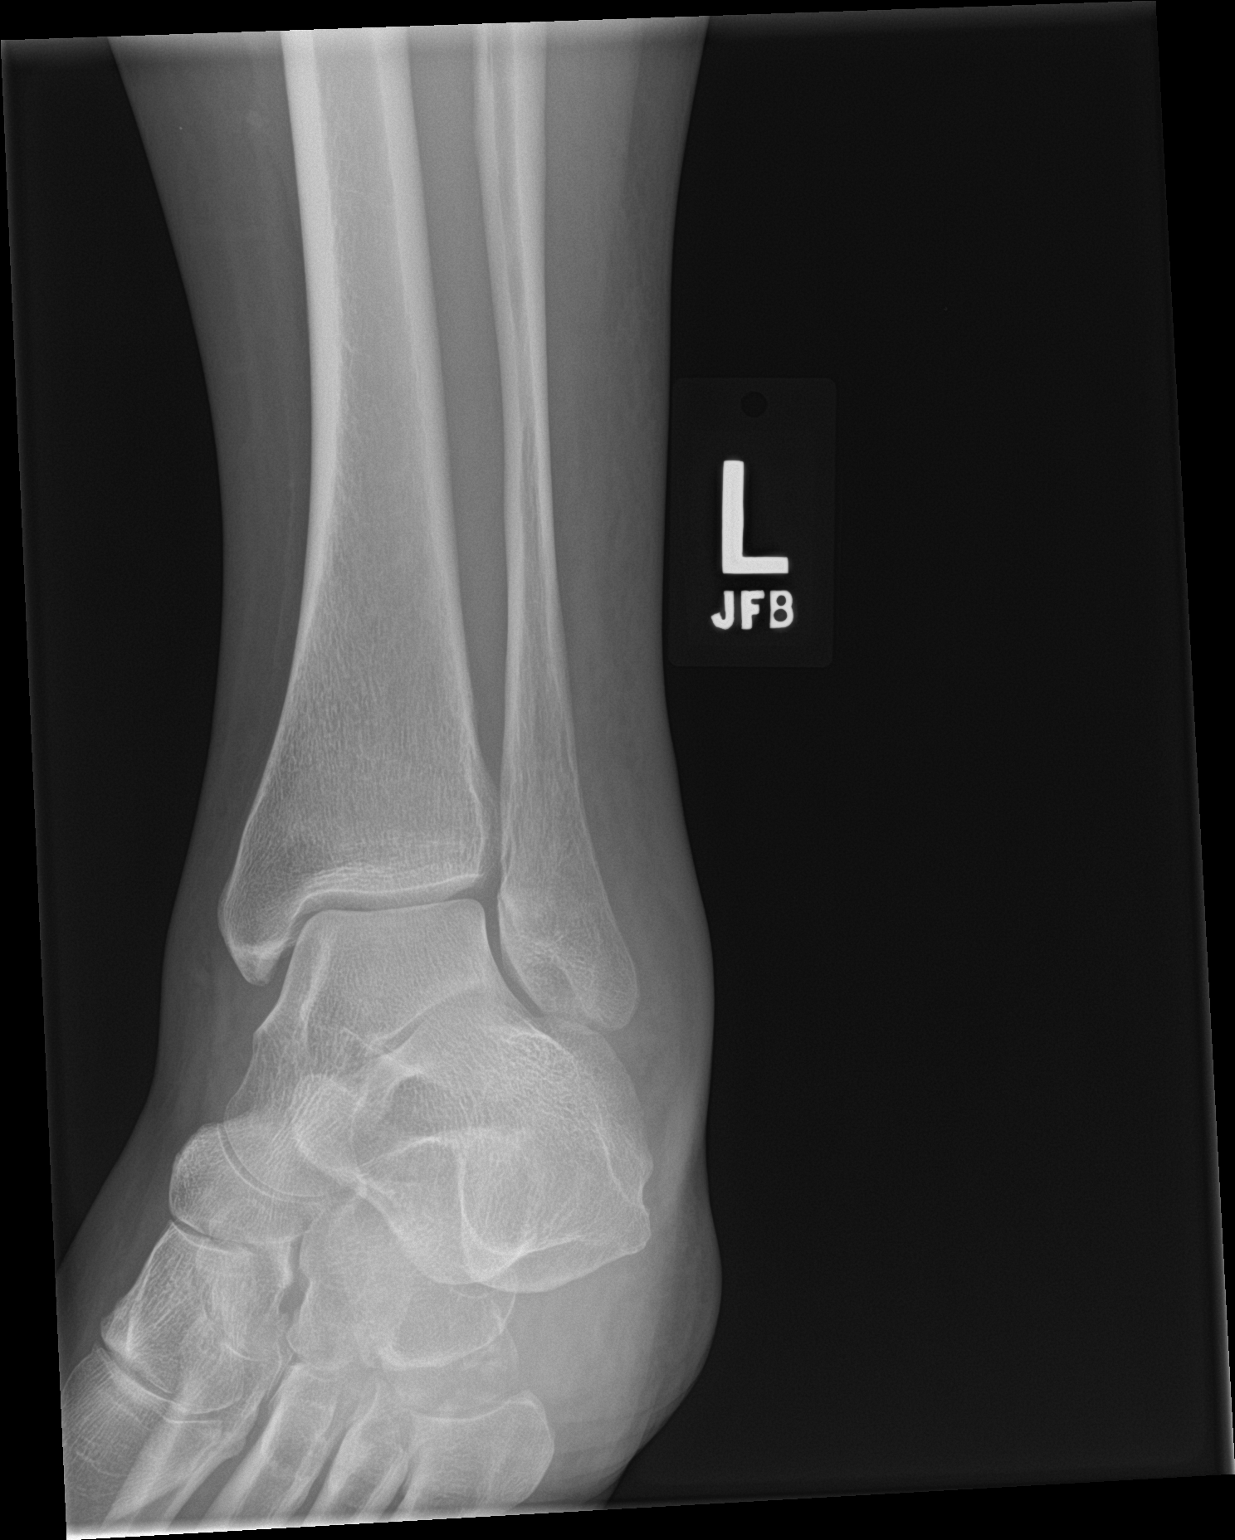

[ankle lat]
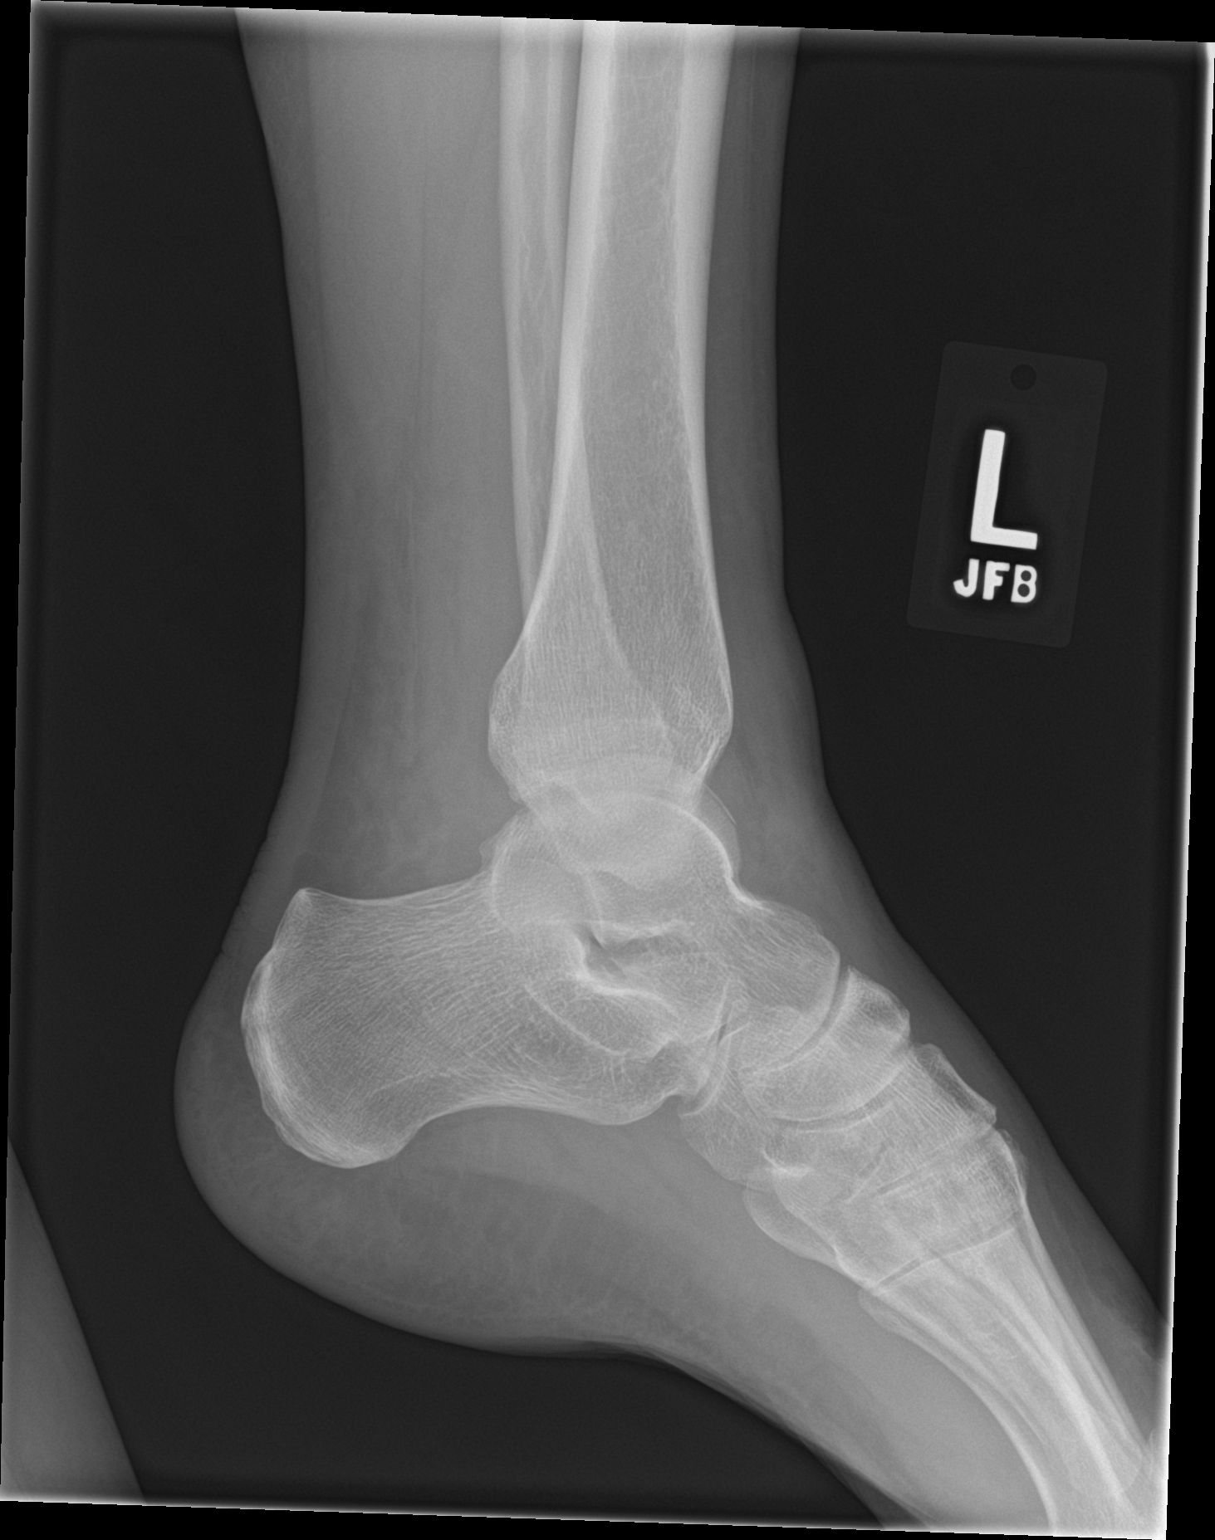

[3 of 3 positions shown; findings below may reference images not displayed]

FINDINGS: Three views of the left ankle submitted. No acute fracture or
subluxation. No radiopaque foreign body. Ankle mortise is preserved.
Soft tissue swelling adjacent to lateral malleolus.
IMPRESSION: No acute fracture or subluxation.  Lateral soft tissue swelling.

## 2016-12-22 ENCOUNTER — Other Ambulatory Visit: Payer: Self-pay | Admitting: Family Medicine

## 2017-01-21 ENCOUNTER — Encounter: Payer: Self-pay | Admitting: Family Medicine

## 2017-01-21 ENCOUNTER — Ambulatory Visit (INDEPENDENT_AMBULATORY_CARE_PROVIDER_SITE_OTHER): Payer: Managed Care, Other (non HMO) | Admitting: Family Medicine

## 2017-01-21 VITALS — BP 93/64 | HR 87 | Temp 98.1°F | Resp 16 | Ht 65.25 in | Wt 154.5 lb

## 2017-01-21 DIAGNOSIS — Z0000 Encounter for general adult medical examination without abnormal findings: Secondary | ICD-10-CM | POA: Diagnosis not present

## 2017-01-21 DIAGNOSIS — F32A Depression, unspecified: Secondary | ICD-10-CM | POA: Insufficient documentation

## 2017-01-21 DIAGNOSIS — Z1231 Encounter for screening mammogram for malignant neoplasm of breast: Secondary | ICD-10-CM | POA: Diagnosis not present

## 2017-01-21 DIAGNOSIS — Z1211 Encounter for screening for malignant neoplasm of colon: Secondary | ICD-10-CM | POA: Diagnosis not present

## 2017-01-21 DIAGNOSIS — F419 Anxiety disorder, unspecified: Secondary | ICD-10-CM | POA: Diagnosis not present

## 2017-01-21 DIAGNOSIS — Z1239 Encounter for other screening for malignant neoplasm of breast: Secondary | ICD-10-CM

## 2017-01-21 DIAGNOSIS — Z23 Encounter for immunization: Secondary | ICD-10-CM

## 2017-01-21 DIAGNOSIS — F329 Major depressive disorder, single episode, unspecified: Secondary | ICD-10-CM

## 2017-01-21 DIAGNOSIS — Z800 Family history of malignant neoplasm of digestive organs: Secondary | ICD-10-CM

## 2017-01-21 LAB — CBC WITH DIFFERENTIAL/PLATELET
BASOS ABS: 0.1 10*3/uL (ref 0.0–0.1)
Basophils Relative: 0.8 % (ref 0.0–3.0)
EOS PCT: 1.9 % (ref 0.0–5.0)
Eosinophils Absolute: 0.2 10*3/uL (ref 0.0–0.7)
HCT: 46 % (ref 36.0–46.0)
HEMOGLOBIN: 15.3 g/dL — AB (ref 12.0–15.0)
LYMPHS ABS: 2.7 10*3/uL (ref 0.7–4.0)
Lymphocytes Relative: 26.7 % (ref 12.0–46.0)
MCHC: 33.2 g/dL (ref 30.0–36.0)
MCV: 96.2 fl (ref 78.0–100.0)
MONO ABS: 0.7 10*3/uL (ref 0.1–1.0)
MONOS PCT: 7 % (ref 3.0–12.0)
NEUTROS PCT: 63.6 % (ref 43.0–77.0)
Neutro Abs: 6.5 10*3/uL (ref 1.4–7.7)
Platelets: 386 10*3/uL (ref 150.0–400.0)
RBC: 4.78 Mil/uL (ref 3.87–5.11)
RDW: 14.2 % (ref 11.5–15.5)
WBC: 10.2 10*3/uL (ref 4.0–10.5)

## 2017-01-21 LAB — LIPID PANEL
Cholesterol: 211 mg/dL — ABNORMAL HIGH (ref 0–200)
HDL: 44.1 mg/dL (ref >39.00)
LDL Cholesterol: 134 mg/dL — ABNORMAL HIGH (ref 0–99)
NONHDL: 166.51
TRIGLYCERIDES: 163 mg/dL — AB (ref 0.0–149.0)
Total CHOL/HDL Ratio: 5
VLDL: 32.6 mg/dL (ref 0.0–40.0)

## 2017-01-21 LAB — COMPREHENSIVE METABOLIC PANEL
ALBUMIN: 4.3 g/dL (ref 3.5–5.2)
ALK PHOS: 67 U/L (ref 39–117)
ALT: 11 U/L (ref 0–35)
AST: 13 U/L (ref 0–37)
BILIRUBIN TOTAL: 0.4 mg/dL (ref 0.2–1.2)
BUN: 11 mg/dL (ref 6–23)
CALCIUM: 10 mg/dL (ref 8.4–10.5)
CO2: 28 mEq/L (ref 19–32)
Chloride: 107 mEq/L (ref 96–112)
Creatinine, Ser: 1.05 mg/dL (ref 0.40–1.20)
GFR: 57.32 mL/min — AB (ref >60.00)
GLUCOSE: 105 mg/dL — AB (ref 70–99)
POTASSIUM: 4.4 meq/L (ref 3.5–5.1)
Sodium: 141 mEq/L (ref 135–145)
TOTAL PROTEIN: 6.8 g/dL (ref 6.0–8.3)

## 2017-01-21 LAB — TSH: TSH: 2.01 u[IU]/mL (ref 0.35–4.50)

## 2017-01-21 MED ORDER — DIAZEPAM 10 MG PO TABS: 10.0000 mg | ORAL_TABLET | Freq: Two times a day (BID) | ORAL | 1 refills | Status: DC | PRN

## 2017-01-21 NOTE — Progress Notes (Signed)
Office Note 01/21/2017  CC:  Chief Complaint  Patient presents with  . Annual Exam    Pt is fasting.     HPI:  Erica King is a 56 y.o. female who is here for annual health maintenance exam. She has a GYN --[facility] [place].  Due for routine eye exam. Exercise: none Diet: appetite is down.  She is too high strung to slow down and eat.   Dental preventatives UTD.  No acute complaints.  Past Medical History:  Diagnosis Date  . Ankle sprain bilat 04/03/18   x-rays neg  . Anxiety and depression   . Cervical cancer ([place]) 1990's   LEEP 1990s  . Chronic constipation   . Family history of colon cancer   . Fibromyalgia    Per old records, rheum w/u neg in past but and pt says she was given dx of fibromyalgia.  Neurontin 1200-1800 mg/day helped initially but she stopped it after it stopped helping (she self d/c'd it 10/2013)  . History of depression   . Hx of adenomatous colonic polyps    Pt says first at age 43.  Most recent colonoscopy 2013 showed no polyps.  Recall 5 yrs.  . Hypothyroidism Approx 2007  . Insomnia   . Left ear hearing loss Longstanding  . 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 Osteoarthritis    Thumbs and low back (MRI L spine 09/2013 DDD with facet arthritis, no nerve impingement or spinal stenosis)  . 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 Tobacco dependence    still 10 cigs/day as of 01/2016    Past Surgical History:  Procedure Laterality Date  . CHOLECYSTECTOMY  1991  . COLONOSCOPY  first at age 50, most recent 07/2012   Has had 6-7 of these, all with polypectomies except most recent one done 07/2012.  Repeat after 07/2017.  Marland Kitchen DEXA  03/05/15   Normal--repeat 5 yrs  . DILATION AND CURETTAGE OF UTERUS    . LEEP  1990s  . TONSILLECTOMY AND ADENOIDECTOMY  1981    Family History  Problem Relation Age of Onset  . CAD Mother        and father  . Multiple sclerosis Father        d. age 92  . Diabetes Mellitus  II Father   . Anxiety disorder Mother   . COPD Mother        d of ruptured SB     Social History   Social History  . Marital status: Married    Spouse name: N/A  . Number of children: N/A  . Years of education: N/A   Occupational History  . Not on file.   Social History Main Topics  . Smoking status: Current Every Day Smoker  . Smokeless tobacco: Never Used  . Alcohol use 0.0 oz/week  . Drug use: Yes    Types: Marijuana  . Sexual activity: Not on file   Other Topics Concern  . Not on file   Social History Narrative   Married, 2 adult children, has grandchildren.  She has 7 sibs.   Orig from [place], relocated to [facility] 08/2012.   Educ: HS and some college.   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   Tobacco 10 pack-yr hx.  Smokes marijuana occasionally, denies use of any other illegal drugs.  Alcohol: none.             Outpatient  Medications Prior to Visit  Medication Sig Dispense Refill  . cyclobenzaprine (FLEXERIL) 10 MG tablet Take 1 tablet (10 mg total) by mouth 3 (three) times daily as needed for muscle spasms. 270 tablet 3  . levothyroxine (SYNTHROID, LEVOTHROID) 75 MCG tablet TAKE 1 TABLET BY MOUTH DAILY BEFORE BREAKFAST 90 tablet 0  . Multiple Vitamins-Minerals (CENTRUM ADULTS PO) Take by mouth daily.    Marland Kitchen PARoxetine (PAXIL) 10 MG tablet Take 1 tablet (10 mg total) by mouth daily. 90 tablet 3  . traZODone (DESYREL) 50 MG tablet 3-4 tabs po qhs prn insomnia 360 tablet 3  . VENTOLIN HFA 108 (90 Base) MCG/ACT inhaler 1-2 puffs by In Vitro route at bedtime as needed.    . diazepam (VALIUM) 10 MG tablet Take 1 tablet (10 mg total) by mouth every 12 (twelve) hours as needed for anxiety. 60 tablet 5  . gabapentin (NEURONTIN) 300 MG capsule 1 tab by mouth four times per day (Patient not taking: Reported on 01/21/2017) 360 capsule 3  . levothyroxine (SYNTHROID, LEVOTHROID) 75 MCG tablet Take 1 tablet (75 mcg total) by mouth daily. (Patient  not taking: Reported on 01/21/2017) 90 tablet 3   No facility-administered medications prior to visit.     Allergies  Allergen Reactions  . Erythromycin Other (See Comments)    Thrush  . Sudafed [Pseudoephedrine Hcl] Other (See Comments)    Heart Races    ROS Review of Systems  Constitutional: Negative for appetite change, chills, fatigue and fever.  HENT: Negative for congestion, dental problem, ear pain and sore throat.   Eyes: Negative for discharge, redness and visual disturbance.  Respiratory: Negative for cough, chest tightness, shortness of breath and wheezing.   Cardiovascular: Negative for chest pain, palpitations and leg swelling.  Gastrointestinal: Negative for abdominal pain, blood in stool, diarrhea, nausea and vomiting.  Genitourinary: Negative for difficulty urinating, dysuria, flank pain, frequency, hematuria and urgency.  Musculoskeletal: Positive for arthralgias (chronic) and myalgias (chronic). Negative for back pain, joint swelling and neck stiffness.  Skin: Negative for pallor and rash.  Neurological: Negative for dizziness, speech difficulty, weakness and headaches.  Hematological: Negative for adenopathy. Does not bruise/bleed easily.  Psychiatric/Behavioral: Negative for confusion and sleep disturbance. The patient is not nervous/anxious.     PE; Blood pressure 93/64, pulse 87, temperature 98.1 F (36.7 C), temperature source Oral, resp. rate 16, height 5' 5.25" (1.657 m), weight 154 lb 8 oz (70.1 kg), SpO2 97 %.  Pt examined with Sharen Hones, CMA, as chaperone.  Gen: Alert, well appearing.  Patient is oriented to person, place, time, and situation. AFFECT: pleasant, lucid thought and speech. ENT: Ears: EACs clear, normal epithelium.  TMs with good light reflex and landmarks bilaterally.  Eyes: no injection, icteris, swelling, or exudate.  EOMI, PERRLA. Nose: no drainage or turbinate edema/swelling.  No injection or focal lesion.  Mouth: lips without  lesion/swelling.  Oral mucosa pink and moist.  Dentition intact and without obvious caries or gingival swelling.  Oropharynx without erythema, exudate, or swelling.  Neck: supple/nontender.  No LAD, mass, or TM.  Carotid pulses 2+ bilaterally, without bruits. CV: RRR, no m/r/g.   LUNGS: CTA bilat, nonlabored resps, good aeration in all lung fields. ABD: soft, NT, ND, BS normal.  No hepatospenomegaly or mass.  No bruits. EXT: no clubbing, cyanosis, or edema.  Musculoskeletal: no joint swelling, erythema, warmth, or tenderness.  ROM of all joints intact. Skin - no sores or suspicious lesions or rashes or color changes  Pertinent labs:  Lab Results  Component Value Date   TSH 1.62 01/30/2016   Lab Results  Component Value Date   WBC 11.4 (H) 01/30/2016   HGB 14.3 01/30/2016   HCT 44.2 01/30/2016   MCV 95.2 01/30/2016   PLT 408.0 (H) 01/30/2016   Lab Results  Component Value Date   CREATININE 0.94 01/30/2016   BUN 14 01/30/2016   NA 142 01/30/2016   K 5.1 01/30/2016   CL 106 01/30/2016   CO2 29 01/30/2016   Lab Results  Component Value Date   ALT 12 01/30/2016   AST 20 01/30/2016   ALKPHOS 72 01/30/2016   BILITOT 0.6 01/30/2016   Lab Results  Component Value Date   CHOL 217 (H) 01/30/2016   Lab Results  Component Value Date   HDL 56.80 01/30/2016   Lab Results  Component Value Date   LDLCALC 131 (H) 01/30/2016   Lab Results  Component Value Date   TRIG 147.0 01/30/2016   Lab Results  Component Value Date   CHOLHDL 4 01/30/2016   ASSESSMENT AND PLAN:   Health maintenance exam Reviewed age and gender appropriate health maintenance issues (prudent diet, regular exercise, health risks of tobacco and excessive alcohol, use of seatbelts, fire alarms in home, use of sunscreen).  Also reviewed age and gender appropriate health screening as well as vaccine recommendations. Vaccines: Shingrix #1 today, o/w  UTD. Labs: Fasting HP labs today. Breast cancer screening:  mammo due this month--will order today. Cervical ca screening: last pap was 2017 and [facility] at [facility] [place], she'll be arranging f/u this year soon.. Colon ca screening: next colonoscopy due 07/2017--[place]. I ordered referral to Dr. Carlean Purl at [facility] today. Osteoporosis screening: next DEXA due 2021.  An After Visit Summary was printed and given to the patient.  FOLLOW UP:  Return in about 6 months (around 07/23/2017) for routine chronic illness f/u.  Signed:  Crissie Sickles, MD           01/21/2017

## 2017-01-21 NOTE — Patient Instructions (Signed)

## 2017-01-21 NOTE — Addendum Note (Signed)
Addended by: Helayne Seminole on: 01/21/2017 09:09 AM   Modules accepted: Orders

## 2017-01-24 ENCOUNTER — Encounter: Payer: Self-pay | Admitting: *Deleted

## 2017-01-25 ENCOUNTER — Telehealth: Payer: Self-pay | Admitting: Internal Medicine

## 2017-01-25 NOTE — Telephone Encounter (Signed)
Received colon reports and placed on Dr. Celesta Aver desk for review. Patient is requesting Dr. Carlean Purl.

## 2017-02-15 ENCOUNTER — Encounter: Payer: Self-pay | Admitting: Internal Medicine

## 2017-02-15 NOTE — Telephone Encounter (Signed)
Dr.Gessner reviewed records and accepted for patient to be scheduled for a colonoscopy. Patient has been scheduled for direct colonoscopy.

## 2017-03-02 ENCOUNTER — Other Ambulatory Visit: Payer: Self-pay | Admitting: Family Medicine

## 2017-03-02 NOTE — Telephone Encounter (Signed)
[facility].  RF request for levothyroxine LOV: 01/21/17 Next ov: 07/22/17 Last written: 12/22/16 #90 w/ 0RF  01/21/17 TSH 2.01

## 2017-03-09 ENCOUNTER — Ambulatory Visit
Admission: RE | Admit: 2017-03-09 | Discharge: 2017-03-09 | Disposition: A | Discharge status: Home / Self Care | Payer: Managed Care, Other (non HMO) | Source: Ambulatory Visit | Attending: Family Medicine | Admitting: Family Medicine

## 2017-03-09 DIAGNOSIS — Z1239 Encounter for other screening for malignant neoplasm of breast: Secondary | ICD-10-CM

## 2017-04-11 ENCOUNTER — Other Ambulatory Visit: Payer: Self-pay | Admitting: Family Medicine

## 2017-04-12 NOTE — Telephone Encounter (Signed)
[facility].

## 2017-04-15 ENCOUNTER — Ambulatory Visit (INDEPENDENT_AMBULATORY_CARE_PROVIDER_SITE_OTHER): Payer: Managed Care, Other (non HMO)

## 2017-04-15 ENCOUNTER — Ambulatory Visit (AMBULATORY_SURGERY_CENTER): Payer: Self-pay | Admitting: *Deleted

## 2017-04-15 ENCOUNTER — Telehealth: Payer: Self-pay | Admitting: Family Medicine

## 2017-04-15 ENCOUNTER — Telehealth: Payer: Self-pay

## 2017-04-15 VITALS — Ht 65.0 in | Wt 151.0 lb

## 2017-04-15 DIAGNOSIS — Z23 Encounter for immunization: Secondary | ICD-10-CM | POA: Diagnosis not present

## 2017-04-15 DIAGNOSIS — Z86010 Personal history of colonic polyps: Secondary | ICD-10-CM

## 2017-04-15 MED ORDER — NA SULFATE-K SULFATE-MG SULF 17.5-3.13-1.6 GM/177ML PO SOLN: 1.0000 [IU] | Freq: Once | ORAL | 0 refills | Status: AC

## 2017-04-15 MED ORDER — GABAPENTIN 300 MG PO CAPS: 300.0000 mg | ORAL_CAPSULE | Freq: Three times a day (TID) | ORAL | 6 refills | Status: DC

## 2017-04-15 NOTE — Progress Notes (Signed)
No egg or soy allergy known to patient  Pt. Has high tolerance to pain will need more than usual  no intubation problems  No diet pills per patient No home 02 use per patient  No blood thinners per patient  Pt has issues with constipation/ pt. Takes stool softeners  No A fib or A flutter    EMMI video sent to pt's e mail  Pt. declines

## 2017-04-15 NOTE — Telephone Encounter (Signed)
Patient informed that she would have to be seen for this medication. Patient refused appointment.

## 2017-04-15 NOTE — Telephone Encounter (Signed)
Patient presented today for Shingrix vaccine and requesting pain medication for her back. Stated that she would be traveling this weekend and needed either Hydrocodone or Gabapentin. She was on Gabapentin but has been off of medication for 2 months. Patient was informed that she would need an appointment for these medications and she stated that she didn't have time to schedule or be seen for an appointment. Patient left with no other complaints after receiving injection.

## 2017-04-15 NOTE — Telephone Encounter (Signed)
Patient is having back pain & will be in the car for a long road trip starting tomorrow. Can she pick up a Rx for Hydrocodone today?

## 2017-04-15 NOTE — Telephone Encounter (Signed)
Noted  

## 2017-04-15 NOTE — Telephone Encounter (Signed)
Patient notified that Gabapentin was sent to pharmacy and patient refused. She inquired about narcotic and again was informed that she would have to be seen for this medication. Patient does not want to be seen at this time.

## 2017-04-15 NOTE — Telephone Encounter (Signed)
I eRx'd gabapentin, but no narcotic pain meds unless seen in the office first to consider whether this type of med is appropriate.

## 2017-04-15 NOTE — Progress Notes (Signed)
Patient presents today for Shingrix #2 injection. Given with no incidence or problems.

## 2017-04-29 ENCOUNTER — Other Ambulatory Visit: Payer: Self-pay | Admitting: Family Medicine

## 2017-04-29 NOTE — Telephone Encounter (Signed)
[facility]  RF request for cyclobenzaprine LOV: 01/21/17 Next ov: 07/22/16 Last written: 02/11/16 #270 w/ 3RF  Please advise. Thanks.

## 2017-05-06 ENCOUNTER — Encounter: Payer: Managed Care, Other (non HMO) | Admitting: Internal Medicine

## 2017-05-12 ENCOUNTER — Encounter: Payer: Self-pay | Admitting: Internal Medicine

## 2017-05-26 ENCOUNTER — Encounter: Payer: Self-pay | Admitting: Internal Medicine

## 2017-05-26 ENCOUNTER — Ambulatory Visit (AMBULATORY_SURGERY_CENTER): Payer: Managed Care, Other (non HMO) | Admitting: Internal Medicine

## 2017-05-26 VITALS — BP 112/59 | HR 72 | Temp 97.3°F | Resp 16 | Ht 65.0 in | Wt 151.0 lb

## 2017-05-26 DIAGNOSIS — Z860100 Personal history of colon polyps, unspecified: Secondary | ICD-10-CM

## 2017-05-26 MED ORDER — SODIUM CHLORIDE 0.9 % IV SOLN
4.0000 mg | Freq: Once | INTRAVENOUS | Status: AC
  Administered 2017-05-26: 4 mg via INTRAVENOUS

## 2017-05-26 MED ORDER — SODIUM CHLORIDE 0.9 % IV SOLN: 500.0000 mL | INTRAVENOUS | Status: DC

## 2017-05-26 NOTE — Op Note (Signed)
[facility] Patient Name: Ronan Dion Procedure Date: 05/26/2017 9:01 AM MRN: [id number] Endoscopist: Gatha Mayer , MD Age: 56 Referring MD:  Date of Birth: 1961-07-08 Gender: Female Account #: [id number] Procedure:                Colonoscopy Indications:              Surveillance: Personal history of colonic polyps                            (unknown histology) on last colonoscopy 5 years ago Medicines:                Propofol per Anesthesia, Monitored Anesthesia Care Procedure:                Pre-Anesthesia Assessment:                           - Prior to the procedure, a History and Physical                            was performed, and patient medications and                            allergies were reviewed. The patient's tolerance of                            previous anesthesia was also reviewed. The risks                            and benefits of the procedure and the sedation                            options and risks were discussed with the patient.                            All questions were answered, and informed consent                            was obtained. Prior Anticoagulants: The patient has                            taken no previous anticoagulant or antiplatelet                            agents. ASA Grade Assessment: II - A patient with                            mild systemic disease. After reviewing the risks                            and benefits, the patient was deemed in                            satisfactory condition to undergo the procedure.  After obtaining informed consent, the colonoscope                            was passed under direct vision. Throughout the                            procedure, the patient's blood pressure, pulse, and                            oxygen saturations were monitored continuously. The                            Colonoscope was introduced through the anus and                advanced to the the cecum, identified by                            appendiceal orifice and ileocecal valve. The                            colonoscopy was performed without difficulty. The                            patient tolerated the procedure well. The quality                            of the bowel preparation was good. The bowel                            preparation used was Miralax. The ileocecal valve,                            appendiceal orifice, and rectum were photographed. Scope In: 9:10:28 AM Scope Out: 9:22:03 AM Scope Withdrawal Time: 0 hours 8 minutes 41 seconds  Total Procedure Duration: 0 hours 11 minutes 35 seconds  Findings:                 The perianal exam findings include skin tags.                           The digital rectal exam was normal.                           A few diverticula were found in the sigmoid colon.                           The exam was otherwise without abnormality on                            direct and retroflexion views. Complications:            No immediate complications. Estimated Blood Loss:     Estimated blood loss: none. Impression:               - Perianal skin tags found on perianal exam.                           -  Diverticulosis in the sigmoid colon.                           - The examination was otherwise normal on direct                            and retroflexion views.                           - No specimens collected. Recommendation:           - Patient has a contact number available for                            emergencies. The signs and symptoms of potential                            delayed complications were discussed with the                            patient. Return to normal activities tomorrow.                            Written discharge instructions were provided to the                            patient.                           - Resume previous diet.                           - Continue  present medications.                           - Repeat colonoscopy in 10 years.                           - No polyps 2013 and 2005 and family hx CRCA was                            erroneous - mother had a "ruptured bowel" in her                            52's so even if was colon cancer too old for                            stratifying to higher risk. personal Hx of colon                            polyp(s) at age 18. Gatha Mayer, MD 05/26/2017 9:29:14 AM This report has been signed electronically.

## 2017-05-26 NOTE — Progress Notes (Signed)
.  Pt's states no medical or surgical changes since previsit or office visit.  Angela/Admitting

## 2017-05-26 NOTE — Patient Instructions (Addendum)
No polyps, no cancer. Next routine colonoscopy or other screening test in 10 years - 2028. Even if mom had colon cancer with that rupture she was in 80's and you have not had polyps in 2013 or 2005 so can wait 10 years since no polyps again.  I appreciate the opportunity to care for you. Gatha Mayer, MD, [facility]  Handout given : Diverticulosis.  YOU HAD AN ENDOSCOPIC PROCEDURE TODAY AT [facility]:   Refer to the procedure report that was given to you for any specific questions about what was found during the examination.  If the procedure report does not answer your questions, please call your gastroenterologist to clarify.  If you requested that your care partner not be given the details of your procedure findings, then the procedure report has been included in a sealed envelope for you to review at your convenience later.  YOU SHOULD EXPECT: Some feelings of bloating in the abdomen. Passage of more gas than usual.  Walking can help get rid of the air that was put into your GI tract during the procedure and reduce the bloating. If you had a lower endoscopy (such as a colonoscopy or flexible sigmoidoscopy) you may notice spotting of blood in your stool or on the toilet paper. If you underwent a bowel prep for your procedure, you may not have a normal bowel movement for a few days.  Please Note:  You might notice some irritation and congestion in your nose or some drainage.  This is from the oxygen used during your procedure.  There is no need for concern and it should clear up in a day or so.  SYMPTOMS TO REPORT IMMEDIATELY:   Following lower endoscopy (colonoscopy or flexible sigmoidoscopy):  Excessive amounts of blood in the stool  Significant tenderness or worsening of abdominal pains  Swelling of the abdomen that is new, acute  Fever of 100F or higher   For urgent or emergent issues, a gastroenterologist can be reached at any hour by calling [phone]  [phone].   DIET:  We do recommend a small meal at first, but then you may proceed to your regular diet.  Drink plenty of fluids but you should avoid alcoholic beverages for 24 hours.  ACTIVITY:  You should plan to take it easy for the rest of today and you should NOT DRIVE or use heavy machinery until tomorrow (because of the sedation medicines used during the test).    FOLLOW UP: Our staff will call the number listed on your records the next business day following your procedure to check on you and address any questions or concerns that you may have regarding the information given to you following your procedure. If we do not reach you, we will leave a message.  However, if you are feeling well and you are not experiencing any problems, there is no need to return our call.  We will assume that you have returned to your regular daily activities without incident.  If any biopsies were taken you will be contacted by phone or by letter within the next 1-3 weeks.  Please call us at [phone] if you have not heard about the biopsies in 3 weeks.    SIGNATURES/CONFIDENTIALITY: You and/or your care partner have signed paperwork which will be entered into your electronic medical record.  These signatures attest to the fact that that the information above on your After Visit Summary has been reviewed and is understood.  Full responsibility  of the confidentiality of this discharge information lies with you and/or your care-partner.

## 2017-05-26 NOTE — Progress Notes (Signed)
A and O x3. Report to RN. Tolerated MAC anesthesia well.

## 2017-05-27 ENCOUNTER — Telehealth: Payer: Self-pay

## 2017-05-27 NOTE — Telephone Encounter (Signed)
No answer. Name identifier. Message left to call after hours number if any questions or concerns.  

## 2017-05-30 ENCOUNTER — Telehealth: Payer: Self-pay | Admitting: *Deleted

## 2017-05-30 NOTE — Telephone Encounter (Signed)
No answer. Name identifier for Erica King. Message left to call if questions or concerns.

## 2017-05-30 NOTE — Telephone Encounter (Deleted)
  Follow up Call-  Call back number 05/26/2017  Post procedure Call Back phone  # [phone]  Permission to leave phone message Yes  Some recent data might be hidden     Patient questions:  Do you have a fever, pain , or abdominal swelling? {yes no:314532} Pain Score  {NUMBERS; 0-10:5044} *  Have you tolerated food without any problems? {yes no:314532}  Have you been able to return to your normal activities? {yes no:314532}  Do you have any questions about your discharge instructions: Diet   {yes no:314532} Medications  {yes no:314532} Follow up visit  {yes no:314532}  Do you have questions or concerns about your Care? {yes no:314532}  Actions: * If pain score is 4 or above: {ACTION; [facility] ENDO PAIN >4:21563::"No action needed, pain <4."}

## 2017-06-23 ENCOUNTER — Encounter: Payer: Self-pay | Admitting: Family Medicine

## 2017-07-04 ENCOUNTER — Other Ambulatory Visit: Payer: Self-pay | Admitting: *Deleted

## 2017-07-04 MED ORDER — DIAZEPAM 10 MG PO TABS: 10.0000 mg | ORAL_TABLET | Freq: Two times a day (BID) | ORAL | 0 refills | Status: DC | PRN

## 2017-07-04 MED ORDER — CYCLOBENZAPRINE HCL 10 MG PO TABS: 10.0000 mg | ORAL_TABLET | Freq: Three times a day (TID) | ORAL | 0 refills | Status: DC | PRN

## 2017-07-04 MED ORDER — LEVOTHYROXINE SODIUM 75 MCG PO TABS: ORAL_TABLET | ORAL | 0 refills | Status: DC

## 2017-07-04 MED ORDER — TRAZODONE HCL 50 MG PO TABS: ORAL_TABLET | ORAL | 0 refills | Status: DC

## 2017-07-04 MED ORDER — PAROXETINE HCL 10 MG PO TABS: 10.0000 mg | ORAL_TABLET | Freq: Every day | ORAL | 0 refills | Status: DC

## 2017-07-04 NOTE — Telephone Encounter (Signed)
Pt LMOM on 07/04/17 at 8:19am stating that she is leaving for [place] tomorrow morning and will run out of her medications while she is away. She is requesting a 30 day supply be sent to [facility] for all of her medications. She stated that she will be gone for 8 weeks. She will return 08/22/16. She stated that her daughter had a baby and there was some complications. Please advise. Thanks.

## 2017-07-04 NOTE — Telephone Encounter (Signed)
Rx for valium faxed. Pt advised and voiced understanding.

## 2017-07-22 ENCOUNTER — Ambulatory Visit: Payer: Managed Care, Other (non HMO) | Admitting: Family Medicine

## 2017-07-31 ENCOUNTER — Other Ambulatory Visit: Payer: Self-pay | Admitting: Family Medicine

## 2017-08-22 ENCOUNTER — Encounter: Payer: Self-pay | Admitting: Family Medicine

## 2017-08-22 ENCOUNTER — Ambulatory Visit (INDEPENDENT_AMBULATORY_CARE_PROVIDER_SITE_OTHER): Payer: Managed Care, Other (non HMO) | Admitting: Family Medicine

## 2017-08-22 ENCOUNTER — Telehealth: Payer: Self-pay

## 2017-08-22 VITALS — BP 103/76 | HR 92 | Temp 98.1°F | Resp 16 | Wt 142.0 lb

## 2017-08-22 DIAGNOSIS — F331 Major depressive disorder, recurrent, moderate: Secondary | ICD-10-CM | POA: Diagnosis not present

## 2017-08-22 DIAGNOSIS — F410 Panic disorder [episodic paroxysmal anxiety] without agoraphobia: Secondary | ICD-10-CM

## 2017-08-22 DIAGNOSIS — F411 Generalized anxiety disorder: Secondary | ICD-10-CM | POA: Diagnosis not present

## 2017-08-22 MED ORDER — VENTOLIN HFA 108 (90 BASE) MCG/ACT IN AERS: 1.0000 | INHALATION_SPRAY | Freq: Four times a day (QID) | RESPIRATORY_TRACT | 0 refills | Status: DC | PRN

## 2017-08-22 MED ORDER — PAROXETINE HCL 10 MG PO TABS: ORAL_TABLET | ORAL | 1 refills | Status: DC

## 2017-08-22 MED ORDER — TRAZODONE HCL 50 MG PO TABS: ORAL_TABLET | ORAL | 1 refills | Status: DC

## 2017-08-22 MED ORDER — ALBUTEROL SULFATE HFA 108 (90 BASE) MCG/ACT IN AERS: 2.0000 | INHALATION_SPRAY | Freq: Four times a day (QID) | RESPIRATORY_TRACT | 0 refills | Status: AC | PRN

## 2017-08-22 MED ORDER — DIAZEPAM 10 MG PO TABS: 10.0000 mg | ORAL_TABLET | Freq: Two times a day (BID) | ORAL | 0 refills | Status: DC | PRN

## 2017-08-22 MED ORDER — CYCLOBENZAPRINE HCL 10 MG PO TABS: 10.0000 mg | ORAL_TABLET | Freq: Three times a day (TID) | ORAL | 1 refills | Status: DC | PRN

## 2017-08-22 NOTE — Telephone Encounter (Signed)
Pharmacy requesting alternative to Ventolin. Insurance states will cover [employer]

## 2017-08-22 NOTE — Progress Notes (Signed)
OFFICE VISIT  08/22/2017   CC:  Chief Complaint  Patient presents with  . Follow-up    RCI    HPI:    Patient is a 57 y.o. Caucasian female who presents for 6 mo f/u anxiety and depression, hypothyroidism, fibromyalgia.  Crying today, says she is getting a divorce, having BIG financial issues due to this. Has been married 84 yrs. Cries a lot, reaching out to family and friends.  She is considering moving back to [place] for her 1 yr of required separation. She denies SI or HI.  Having more panicky feelings/attacks, taking her valium bid regularly.  Past Medical History:  Diagnosis Date  . Anemia   . Ankle sprain bilat 04/03/18   x-rays neg  . Anxiety    having panic attacks in my sleep  . Anxiety and depression   . Cervical cancer ([place]) 1990's   LEEP 1990s  . Cervical cancer ([place])    leep procedure  . Chronic constipation   . Depression    on Paxil  . Family history of colon cancer    pt. not sure.  Next colonoscopy due 2028.  . Fibromyalgia    Per old records, rheum w/u neg in past but and pt says she was given dx of fibromyalgia.  Neurontin 1200-1800 mg/day helped initially but she stopped it after it stopped helping (she self d/c'd it 10/2013)  . Fibromyalgia   . History of depression   . Hx of adenomatous colonic polyps    Pt says first at age 60.  Most recent colonoscopy 2013 showed no polyps.  05/2017 repeat showed no polyps.--Recall 10 yrs.  . Hypothyroidism Approx 2007  . Insomnia   . Left ear hearing loss Longstanding  . Neuromuscular disorder (HCC)    fibromyalgia  . 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 Osteoarthritis    Thumbs and low back (MRI L spine 09/2013 DDD with facet arthritis, no nerve impingement or spinal stenosis)  . Osteoarthritis   . 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 Tobacco dependence    still 10 cigs/day as of 01/2016    Past Surgical History:  Procedure Laterality Date  . CHOLECYSTECTOMY  1991  .  COLONOSCOPY  first at age 66; 07/2012. 05/2017   Has had 6-7 of these, all with polypectomies except most recent one done 07/2012.  Repeat 05/2017--no polyps--recall 10 yrs per GI.  Marland Kitchen DEXA  03/05/15   Normal--repeat 5 yrs  . DILATION AND CURETTAGE OF UTERUS    . LEEP  1990s  . TONSILLECTOMY AND ADENOIDECTOMY  1981    Outpatient Medications Prior to Visit  Medication Sig Dispense Refill  . b complex vitamins tablet Take 1 tablet by mouth daily.    . cyclobenzaprine (FLEXERIL) 10 MG tablet Take 1 tablet (10 mg total) 3 (three) times daily as needed by mouth. for muscle spams 90 tablet 0  . diazepam (VALIUM) 10 MG tablet Take 1 tablet (10 mg total) every 12 (twelve) hours as needed by mouth for anxiety. 60 tablet 0  . Docusate Calcium (STOOL SOFTENER PO) Take 1 tablet by mouth daily as needed.    Marland Kitchen levothyroxine (SYNTHROID, LEVOTHROID) 75 MCG tablet TAKE 1 TABLET BY MOUTH EVERY DAY BEFORE BREAKFAST 30 tablet 5  . PARoxetine (PAXIL) 10 MG tablet TAKE 1 TABLET (10 MG TOTAL) DAILY BY MOUTH. 30 tablet 5  . traZODone (DESYREL) 50 MG tablet 3-4 tabs po qhs prn insomnia 120 tablet 0  .  VENTOLIN HFA 108 (90 Base) MCG/ACT inhaler 1-2 puffs by In Vitro route at bedtime as needed.    . Multiple Vitamins-Minerals (CENTRUM ADULTS PO) Take by mouth daily.     Facility-Administered Medications Prior to Visit  Medication Dose Route Frequency Provider Last Rate Last Dose  . 0.9 %  sodium chloride infusion  500 mL Intravenous Continuous Gatha Mayer, MD        Allergies  Allergen Reactions  . Erythromycin Other (See Comments)    Thrush  . Sudafed [Pseudoephedrine Hcl] Other (See Comments)    Heart Races    ROS As per HPI  PE: Blood pressure 103/76, pulse 92, temperature 98.1 F (36.7 C), temperature source Oral, resp. rate 16, weight 142 lb (64.4 kg), SpO2 98 %. Gen: Alert, well appearing.  Patient is oriented to person, place, time, and situation. AFFECT: crying,sad, anxious.  Lucid thought  and speech. No further exam today.  LABS:  Lab Results  Component Value Date   TSH 2.01 01/21/2017   Lab Results  Component Value Date   WBC 10.2 01/21/2017   HGB 15.3 (H) 01/21/2017   HCT 46.0 01/21/2017   MCV 96.2 01/21/2017   PLT 386.0 01/21/2017   Lab Results  Component Value Date   CREATININE 1.05 01/21/2017   BUN 11 01/21/2017   NA 141 01/21/2017   K 4.4 01/21/2017   CL 107 01/21/2017   CO2 28 01/21/2017   Lab Results  Component Value Date   ALT 11 01/21/2017   AST 13 01/21/2017   ALKPHOS 67 01/21/2017   BILITOT 0.4 01/21/2017   Lab Results  Component Value Date   CHOL 211 (H) 01/21/2017   Lab Results  Component Value Date   HDL 44.10 01/21/2017   Lab Results  Component Value Date   LDLCALC 134 (H) 01/21/2017   Lab Results  Component Value Date   TRIG 163.0 (H) 01/21/2017   Lab Results  Component Value Date   CHOLHDL 5 01/21/2017    IMPRESSION AND PLAN:  MDD, recurrent episode, w/out psychotic features. Discussed options today, gave emotional support regarding her life changes/divorce. Increase paxil to 20mg  qd, take 1 qd alt with 1/2 qd.  She is worried that going up on dose will make her have blunted affect. She declines counseling b/c she doesn't know if she'll live here in near future. She takes a valium twice a day, gave 90d supply today, also RF'd levoxyl, flexeril, and trazodone to [facility] locally. No blood tests needed.  An After Visit Summary was printed and given to the patient.  FOLLOW UP: Return in about 4 weeks (around 09/19/2017) for f/u mood/anx.  Signed:  Crissie Sickles, MD           08/22/2017

## 2017-08-22 NOTE — Telephone Encounter (Signed)
OK, proAir eRx'd.

## 2017-08-24 ENCOUNTER — Telehealth: Payer: Self-pay

## 2017-08-24 ENCOUNTER — Encounter: Payer: Self-pay | Admitting: Family Medicine

## 2017-08-24 NOTE — Telephone Encounter (Signed)
Patient called to check on status of well being. She left office on 09/01/17 upset about marital situation.  Spoke with patient and she stated that she was some better; seeing a lawyer today. Patient consoled and instructed to call back if she needed anything from [place].  Patient appreciative of phone call and thanked [place] for calling.

## 2017-09-19 ENCOUNTER — Encounter: Payer: Self-pay | Admitting: Family Medicine

## 2017-09-19 ENCOUNTER — Ambulatory Visit (INDEPENDENT_AMBULATORY_CARE_PROVIDER_SITE_OTHER): Payer: Managed Care, Other (non HMO) | Admitting: Family Medicine

## 2017-09-19 VITALS — BP 107/75 | HR 87 | Temp 97.9°F | Resp 16 | Ht 65.25 in | Wt 139.0 lb

## 2017-09-19 DIAGNOSIS — F332 Major depressive disorder, recurrent severe without psychotic features: Secondary | ICD-10-CM | POA: Diagnosis not present

## 2017-09-19 DIAGNOSIS — F4323 Adjustment disorder with mixed anxiety and depressed mood: Secondary | ICD-10-CM

## 2017-09-19 DIAGNOSIS — Z23 Encounter for immunization: Secondary | ICD-10-CM | POA: Diagnosis not present

## 2017-09-19 NOTE — Progress Notes (Signed)
OFFICE VISIT  09/22/2017   CC:  Chief Complaint  Patient presents with  . Follow-up    Mood/Anxiety     HPI:    Patient is a 57 y.o. Caucasian female who presents for 1 mo f/u mood/anxiety. Recurrent MDD w/out psychotic features, also acute adjustment d/o with mood/anxiety sx's: DIVORCE. Increased paxil to 20 mg qd alt with 10 mg qd.  She declined counseling. Continued with valium bid.  Still not doing good at all.  Excessive anger, stress, depression/anxiety regarding divorce and financial issues. She has retained an attorney b/c husband and her not agreeing on finances of the separation. Emotionally distraught today.  She is fearful that husband is going to take her support dog, Dylan.  She feels emotionally supported by friends/family. She tells me today that she did not go up on her paxil yet.  She is afraid of the potential emotional blunting it may cause. She eats 1 meal per day.  She declines counseling repeatedly. She denies SI or HI. Continues to take her valium bid.  Has some LB pain, gets radiation down both legs periodically.  Standing and leaning over some on a countertop helps it pass in about 1 min.  Past Medical History:  Diagnosis Date  . Anemia   . Ankle sprain bilat 04/03/18   x-rays neg  . Anxiety    having panic attacks in my sleep  . Anxiety and depression   . Cervical cancer ([place]) 1990's   LEEP 1990s  . Cervical cancer ([place])    leep procedure  . Chronic constipation   . Depression    on Paxil  . Family history of colon cancer    pt. not sure.  Next colonoscopy due 2028.  . Fibromyalgia    Per old records, rheum w/u neg in past but and pt says she was given dx of fibromyalgia.  Neurontin 1200-1800 mg/day helped initially but she stopped it after it stopped helping (she self d/c'd it 10/2013)  . Fibromyalgia   . History of depression   . Hx of adenomatous colonic polyps    Pt says first at age 51.  Most recent colonoscopy 2013 showed no polyps.   05/2017 repeat showed no polyps.--Recall 10 yrs.  . Hypothyroidism Approx 2007  . Insomnia   . Left ear hearing loss Longstanding  . Neuromuscular disorder (HCC)    fibromyalgia  . 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 Osteoarthritis    Thumbs and low back (MRI L spine 09/2013 DDD with facet arthritis, no nerve impingement or spinal stenosis)  . Osteoarthritis   . 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 Tobacco dependence    still 10 cigs/day as of 01/2016    Past Surgical History:  Procedure Laterality Date  . CHOLECYSTECTOMY  1991  . COLONOSCOPY  first at age 34; 07/2012. 05/2017   Has had 6-7 of these, all with polypectomies except most recent one done 07/2012.  Repeat 05/2017--no polyps--recall 10 yrs per GI.  Marland Kitchen DEXA  03/05/15   Normal--repeat 5 yrs  . DILATION AND CURETTAGE OF UTERUS    . LEEP  1990s  . TONSILLECTOMY AND ADENOIDECTOMY  1981    Outpatient Medications Prior to Visit  Medication Sig Dispense Refill  . albuterol (PROAIR HFA) 108 (90 Base) MCG/ACT inhaler Inhale 2 puffs into the lungs every 6 (six) hours as needed for wheezing or shortness of breath. 1 Inhaler 0  . b complex vitamins tablet Take  1 tablet by mouth daily.    . cyclobenzaprine (FLEXERIL) 10 MG tablet Take 1 tablet (10 mg total) by mouth 3 (three) times daily as needed. for muscle spams 90 tablet 1  . diazepam (VALIUM) 10 MG tablet Take 1 tablet (10 mg total) by mouth every 12 (twelve) hours as needed for anxiety. 180 tablet 0  . Docusate Calcium (STOOL SOFTENER PO) Take 1 tablet by mouth daily as needed.    Marland Kitchen levothyroxine (SYNTHROID, LEVOTHROID) 75 MCG tablet TAKE 1 TABLET BY MOUTH EVERY DAY BEFORE BREAKFAST 30 tablet 5  . PARoxetine (PAXIL) 10 MG tablet 1 tab qd alternating with 2 tabs qd 45 tablet 1  . traZODone (DESYREL) 50 MG tablet 3-4 tabs po qhs prn insomnia 120 tablet 1  . Multiple Vitamins-Minerals (CENTRUM ADULTS PO) Take by mouth daily.    Marland Kitchen 0.9 %  sodium  chloride infusion      No facility-administered medications prior to visit.     Allergies  Allergen Reactions  . Erythromycin Other (See Comments)    Thrush  . Sudafed [Pseudoephedrine Hcl] Other (See Comments)    Heart Races    ROS As per HPI  PE: Blood pressure 107/75, pulse 87, temperature 97.9 F (36.6 C), temperature source Oral, resp. rate 16, height 5' 5.25" (1.657 m), weight 139 lb (63 kg), SpO2 100 %. Gen: Alert, well appearing.  Patient is oriented to person, place, time, and situation. AFFECT: crying, talking nonstop in frustration/anger about husband. No further exam today.  LABS:  Lab Results  Component Value Date   TSH 2.01 01/21/2017     Chemistry      Component Value Date/Time   NA 141 01/21/2017 0843   K 4.4 01/21/2017 0843   CL 107 01/21/2017 0843   CO2 28 01/21/2017 0843   BUN 11 01/21/2017 0843   CREATININE 1.05 01/21/2017 0843      Component Value Date/Time   CALCIUM 10.0 01/21/2017 0843   ALKPHOS 67 01/21/2017 0843   AST 13 01/21/2017 0843   ALT 11 01/21/2017 0843   BILITOT 0.4 01/21/2017 0843       IMPRESSION AND PLAN:  MDD, recurrent, w/out psychotic features, superimposed adjustment d/o with mood/anxiety sx's: largely stemming around a messy divorce. She is open to counseling: referred pt to Dr. Gaynell Face today. I encouraged her to [place] increasing her paxil to 20 mg qd and see how things go. She can even try taking 10mg  qd alternating with 20mg  qd.  Flu vaccine given today.  FOLLOW UP: Return in about 4 weeks (around 10/17/2017) for f/u MDD/anxiety.  Signed:  Crissie Sickles, MD           09/22/2017

## 2017-09-29 ENCOUNTER — Ambulatory Visit (INDEPENDENT_AMBULATORY_CARE_PROVIDER_SITE_OTHER): Payer: 59 | Admitting: Psychology

## 2017-09-29 DIAGNOSIS — F331 Major depressive disorder, recurrent, moderate: Secondary | ICD-10-CM

## 2017-09-29 DIAGNOSIS — F411 Generalized anxiety disorder: Secondary | ICD-10-CM | POA: Diagnosis not present

## 2017-10-06 ENCOUNTER — Ambulatory Visit (INDEPENDENT_AMBULATORY_CARE_PROVIDER_SITE_OTHER): Payer: 59 | Admitting: Psychology

## 2017-10-06 DIAGNOSIS — F411 Generalized anxiety disorder: Secondary | ICD-10-CM | POA: Diagnosis not present

## 2017-10-06 DIAGNOSIS — F331 Major depressive disorder, recurrent, moderate: Secondary | ICD-10-CM | POA: Diagnosis not present

## 2017-10-12 ENCOUNTER — Ambulatory Visit: Payer: 59 | Admitting: Psychology

## 2017-10-13 ENCOUNTER — Ambulatory Visit (INDEPENDENT_AMBULATORY_CARE_PROVIDER_SITE_OTHER): Payer: 59 | Admitting: Psychology

## 2017-10-13 DIAGNOSIS — F411 Generalized anxiety disorder: Secondary | ICD-10-CM

## 2017-10-13 DIAGNOSIS — F331 Major depressive disorder, recurrent, moderate: Secondary | ICD-10-CM

## 2017-10-19 ENCOUNTER — Encounter: Payer: Self-pay | Admitting: Family Medicine

## 2017-10-19 ENCOUNTER — Ambulatory Visit (INDEPENDENT_AMBULATORY_CARE_PROVIDER_SITE_OTHER): Payer: Managed Care, Other (non HMO) | Admitting: Family Medicine

## 2017-10-19 VITALS — BP 118/72 | HR 83 | Resp 16 | Ht 65.25 in | Wt 137.0 lb

## 2017-10-19 DIAGNOSIS — F4323 Adjustment disorder with mixed anxiety and depressed mood: Secondary | ICD-10-CM

## 2017-10-19 DIAGNOSIS — F331 Major depressive disorder, recurrent, moderate: Secondary | ICD-10-CM | POA: Diagnosis not present

## 2017-10-19 DIAGNOSIS — Z658 Other specified problems related to psychosocial circumstances: Secondary | ICD-10-CM | POA: Diagnosis not present
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
## 2017-10-20 ENCOUNTER — Ambulatory Visit (INDEPENDENT_AMBULATORY_CARE_PROVIDER_SITE_OTHER): Payer: 59 | Admitting: Psychology

## 2017-10-20 DIAGNOSIS — F411 Generalized anxiety disorder: Secondary | ICD-10-CM

## 2017-10-20 DIAGNOSIS — F331 Major depressive disorder, recurrent, moderate: Secondary | ICD-10-CM

## 2017-10-26 ENCOUNTER — Ambulatory Visit (INDEPENDENT_AMBULATORY_CARE_PROVIDER_SITE_OTHER): Payer: 59 | Admitting: Psychology

## 2017-10-26 DIAGNOSIS — F411 Generalized anxiety disorder: Secondary | ICD-10-CM | POA: Diagnosis not present

## 2017-10-26 DIAGNOSIS — F331 Major depressive disorder, recurrent, moderate: Secondary | ICD-10-CM | POA: Diagnosis not present

## 2017-10-31 ENCOUNTER — Ambulatory Visit (INDEPENDENT_AMBULATORY_CARE_PROVIDER_SITE_OTHER): Payer: 59 | Admitting: Psychology

## 2017-10-31 DIAGNOSIS — F331 Major depressive disorder, recurrent, moderate: Secondary | ICD-10-CM

## 2017-10-31 DIAGNOSIS — F411 Generalized anxiety disorder: Secondary | ICD-10-CM | POA: Diagnosis not present

## 2017-11-07 ENCOUNTER — Ambulatory Visit (INDEPENDENT_AMBULATORY_CARE_PROVIDER_SITE_OTHER): Payer: 59 | Admitting: Psychology

## 2017-11-07 DIAGNOSIS — F411 Generalized anxiety disorder: Secondary | ICD-10-CM

## 2017-11-07 DIAGNOSIS — F331 Major depressive disorder, recurrent, moderate: Secondary | ICD-10-CM | POA: Diagnosis not present

## 2017-11-14 ENCOUNTER — Ambulatory Visit (INDEPENDENT_AMBULATORY_CARE_PROVIDER_SITE_OTHER): Payer: 59 | Admitting: Psychology

## 2017-11-14 DIAGNOSIS — F331 Major depressive disorder, recurrent, moderate: Secondary | ICD-10-CM | POA: Diagnosis not present

## 2017-11-15 ENCOUNTER — Ambulatory Visit (INDEPENDENT_AMBULATORY_CARE_PROVIDER_SITE_OTHER): Payer: Managed Care, Other (non HMO) | Admitting: Family Medicine

## 2017-11-15 ENCOUNTER — Encounter: Payer: Self-pay | Admitting: Family Medicine

## 2017-11-15 VITALS — BP 110/60 | HR 92 | Resp 16 | Ht 65.25 in | Wt 139.0 lb

## 2017-11-15 DIAGNOSIS — F4323 Adjustment disorder with mixed anxiety and depressed mood: Secondary | ICD-10-CM | POA: Diagnosis not present

## 2017-11-15 DIAGNOSIS — F331 Major depressive disorder, recurrent, moderate: Secondary | ICD-10-CM

## 2017-11-15 DIAGNOSIS — Z658 Other specified problems related to psychosocial circumstances: Secondary | ICD-10-CM | POA: Diagnosis not present

## 2017-11-15 MED ORDER — PAROXETINE HCL 20 MG PO TABS: 20.0000 mg | ORAL_TABLET | Freq: Every day | ORAL | 1 refills | Status: AC

## 2017-11-15 MED ORDER — CYCLOBENZAPRINE HCL 10 MG PO TABS: 10.0000 mg | ORAL_TABLET | Freq: Three times a day (TID) | ORAL | 1 refills | Status: AC | PRN

## 2017-11-15 MED ORDER — TRAZODONE HCL 50 MG PO TABS: ORAL_TABLET | ORAL | 1 refills | Status: AC

## 2017-11-15 MED ORDER — LEVOTHYROXINE SODIUM 75 MCG PO TABS: ORAL_TABLET | ORAL | 1 refills | Status: AC

## 2017-11-15 MED ORDER — DIAZEPAM 10 MG PO TABS: 10.0000 mg | ORAL_TABLET | Freq: Two times a day (BID) | ORAL | 1 refills | Status: AC | PRN
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
## 2018-02-14 ENCOUNTER — Other Ambulatory Visit: Payer: Self-pay | Admitting: Family Medicine

## 2018-02-23 ENCOUNTER — Encounter: Payer: Self-pay | Admitting: Family Medicine

## 2018-06-25 ENCOUNTER — Other Ambulatory Visit: Payer: Self-pay | Admitting: Family Medicine

## 2018-07-04 ENCOUNTER — Other Ambulatory Visit: Payer: Self-pay | Admitting: Family Medicine
